# Patient Record
Sex: Male | Born: 1955 | Race: White | Hispanic: No | Marital: Married | Smoking: Never smoker
Health system: Southern US, Community
[De-identification: ages and names within clinical notes are randomized; demographics above are authoritative.]

## PROBLEM LIST (undated history)

## (undated) DIAGNOSIS — F329 Major depressive disorder, single episode, unspecified: Secondary | ICD-10-CM

## (undated) DIAGNOSIS — F32A Depression, unspecified: Secondary | ICD-10-CM

## (undated) DIAGNOSIS — I10 Essential (primary) hypertension: Secondary | ICD-10-CM

## (undated) DIAGNOSIS — T7840XA Allergy, unspecified, initial encounter: Secondary | ICD-10-CM

## (undated) HISTORY — PX: APPENDECTOMY: SHX54

## (undated) HISTORY — DX: Allergy, unspecified, initial encounter: T78.40XA

## (undated) HISTORY — DX: Essential (primary) hypertension: I10

## (undated) HISTORY — DX: Major depressive disorder, single episode, unspecified: F32.9

## (undated) HISTORY — DX: Depression, unspecified: F32.A

---

## 2004-04-24 ENCOUNTER — Inpatient Hospital Stay (HOSPITAL_COMMUNITY): Admission: EM | Admit: 2004-04-24 | Discharge: 2004-04-25 | Payer: Self-pay | Admitting: Family Medicine

## 2004-04-24 ENCOUNTER — Encounter (INDEPENDENT_AMBULATORY_CARE_PROVIDER_SITE_OTHER): Payer: Self-pay | Admitting: Specialist

## 2004-04-24 ENCOUNTER — Ambulatory Visit (HOSPITAL_COMMUNITY): Admission: RE | Admit: 2004-04-24 | Discharge: 2004-04-24 | Payer: Self-pay | Admitting: Family Medicine

## 2007-02-28 ENCOUNTER — Emergency Department (HOSPITAL_COMMUNITY): Admission: EM | Admit: 2007-02-28 | Discharge: 2007-02-28 | Payer: Self-pay | Admitting: Emergency Medicine

## 2007-11-10 ENCOUNTER — Ambulatory Visit (HOSPITAL_COMMUNITY): Admission: RE | Admit: 2007-11-10 | Discharge: 2007-11-10 | Payer: Self-pay | Admitting: Gastroenterology

## 2009-02-10 ENCOUNTER — Emergency Department (HOSPITAL_COMMUNITY): Admission: EM | Admit: 2009-02-10 | Discharge: 2009-02-10 | Payer: Self-pay | Admitting: Family Medicine

## 2010-06-27 NOTE — Op Note (Signed)
NAMEVANDY, Jeremiah Mitchell             ACCOUNT NO.:  [id number]   MEDICAL RECORD NO.:  [id number]          PATIENT TYPE:  AMB   LOCATION:  ENDO                         FACILITY:  [facility]   PHYSICIAN:  James L. Malon Kindle., M.D.DATE OF BIRTH:  1956-02-03   DATE OF PROCEDURE:  11/10/2007  DATE OF DISCHARGE:                               OPERATIVE REPORT   PROCEDURE:  Colonoscopy.   MEDICATIONS:  Fentanyl 75 mcg, Versed 7.5 mg IV.   INDICATIONS:  Cancer screening.   DESCRIPTION OF PROCEDURE:  In the left lateral decubitus position, the  Pentax pediatric colonoscope was inserted and advanced. Prep excellent.  Reached the cecum using abdominal pressure and placing the patient in  the supine position.  The ileocecal valve and appendiceal orifice were  seen. The scope was withdrawn. The cecum, ascending, transverse,  descending and sigmoid colons seen well. No diverticulosis, polyps,  masses or other lesions. No significant hemorrhoids in the rectum on  forward and retroflexed views. Scope withdrawn.  The patient tolerated  the procedure well.   ASSESSMENT:  Normal screening colonoscopy.   PLAN:  Routine follow-up with stool Hemoccults in 3-5 years and repeat  colonoscopy in 10 years.           ______________________________  Llana Aliment Malon Kindle., M.D.     Waldron Session  D:  11/10/2007  T:  11/10/2007  Job:  [id number]

## 2010-06-30 NOTE — H&P (Signed)
NAMESAADIQ, POCHE             ACCOUNT NO.:  [id number]   MEDICAL RECORD NO.:  [id number]          PATIENT TYPE:  OUT   LOCATION:  XRAY                         FACILITY:  [facility]   PHYSICIAN:  Cherylynn Ridges III, M.D.DATE OF BIRTH:  04/17/1955   DATE OF ADMISSION:  04/24/2004  DATE OF DISCHARGE:                                HISTORY & PHYSICAL   CHIEF COMPLAINT:  The patient is a 55 year old with acute appendicitis.   HISTORY OF PRESENT ILLNESS:  The patient was well until yesterday when he  developed abdominal pain which actually worsened throughout the evening, but  then got better later on. He got ill again today, was not scheduled to go  into work, but would not have gone into work and came into the emergency  room with abdominal pain localized to the right lower quadrant.  He went to  Urgent Care and was subsequently sent to the ED after CT demonstrated acute  appendicitis.  Surgical consultation was obtained.   PAST MEDICAL HISTORY:  Unremarkable.   PAST SURGICAL HISTORY:  None.   ALLERGIES:  PENICILLIN.   MEDICATIONS:  None.   FAMILY HISTORY:  Unremarkable with the exception that his mother is a  longterm retired Engineer, civil (consulting) from the [employer] emergency department.   REVIEW OF SYSTEMS:  He has had no nausea and no vomiting, but he has had  some chills and fevers at home.   PHYSICAL EXAMINATION:  VITAL SIGNS:  He is afebrile.  Other vital signs are  stable.  HEENT:  He is normocephalic, atraumatic, and anicteric.  NECK:  Supple.  CHEST:  Clear to auscultation.  Normal excursion.  HEART:  Regular rate and rhythm with no murmurs, rubs, or gallops or heaves.  ABDOMEN:  Soft and tender in the right lower quadrant with some localized  guarding.  RECTAL:  No palpable masses.  NEUROLOGY:  Cranial nerves II-XII grossly intact.   LABORATORY DATA:  White count over 11,000 with a left shift, hemoglobin is  normal.   IMPRESSION:  Acute appendicitis.   PLAN:   Laparoscopic appendectomy and possible open.  The risks and benefits  have been explained to the patient and he wishes to proceed.      JOW/MEDQ  D:  04/24/2004  T:  04/24/2004  Job:  [id number]

## 2010-06-30 NOTE — Op Note (Signed)
NAMEJAKOBIE, Jeremiah Mitchell             ACCOUNT NO.:  [id number]   MEDICAL RECORD NO.:  [id number]          PATIENT TYPE:  INP   LOCATION:  3001                         FACILITY:  [facility]   PHYSICIAN:  Cherylynn Ridges III, M.D.DATE OF BIRTH:  06/02/1955   DATE OF PROCEDURE:  04/24/2004  DATE OF DISCHARGE:                                 OPERATIVE REPORT   PREOPERATIVE DIAGNOSIS:  Acute appendicitis.   POSTOPERATIVE DIAGNOSIS:  Acute appendicitis.   PROCEDURE:  Laparoscopic appendectomy.   SURGEON:  Cherylynn Ridges, M.D.   ASSISTANT:  None.   ANESTHESIA:  General endotracheal.   ESTIMATED BLOOD LOSS:  Less than 20 mL.   COMPLICATIONS:  None.   CONDITION:  Stable.   FINDINGS:  Acutely inflamed appendix without perforation.   INDICATION FOR OPERATION:  The patient is a 55 year old with localized right  lower quadrant tenderness associated with mildly elevated white blood cell  count, who now comes in for a laparoscopic appendectomy.   OPERATION:  The patient was taken to the operating room and placed on the  table in supine position.  After an adequate endotracheal anesthetic was  administered, he was prepped and draped in the usual sterile manner exposing  the midline and the right lower quadrant.   A supraumbilical curvilinear incision was made using a #11 blade.  It was  taken down to the midline fascia using hemostat clamp, and it was through  that midline fascia that a Veress needle was passed into the peritoneal  cavity and confirmed to be in position using the saline test.  Once this was  done, carbon dioxide insufflation was instilled through the Veress needle  into the peritoneal cavity up to a maximal intra-abdominal pressure of 15  mmHg.   Once this was done, an Optiview 11 mm trocar and cannula were passed through  the supraumbilical fascia into the peritoneal cavity with the inserted  laparoscope with attached camera and light source.  We subsequently passed  the  right costal margin 5 mm cannula and a suprapubic 12 mm cannula under  direct vision into the peritoneal cavity.  Once this was done, the patient  was placed in Trendelenburg, the left side was tilted down.   The appendix could be seen attached sort of to the right lower quadrant and  right paracolic area but was not retroperitonealized.  We were able to  mobilize it up into the midportion of the wound and subsequently dissect out  the appendix at the base of the cecum.  We separated it and got an adequate  window between that, the mesoappendix and the base of the cecum.  It was  through this window that a 3.5 mm Endo-GIA was passed around the base of the  cecum and the appendix, transecting it at that point.  We then used the 2.5  mm stapler and cutter across the mesoappendix, transecting it.  We brought  the appendix out through the suprapubic cannula site using an EndoCatch bag,  not contaminating the skin.   We reinserted the suprapubic cannula, then irrigated in the right lower  quadrant with saline.  Once this was done and we had adequate hemostasis, we  removed all cannulas and aspirated all gas.   The supraumbilical fascia was reapproximated using a figure-of-eight stitch  of 0 Vicryl.  The skin was closed at all sites using a running subcuticular  stitch of 5-0 Vicryl.  Marcaine 0.25% with epinephrine was injected at all  sites.  All needle counts, sponge counts and instrument counts were correct.      JOW/MEDQ  D:  04/24/2004  T:  04/25/2004  Job:  [id number]

## 2010-11-02 LAB — POCT URINALYSIS DIP (DEVICE)
Glucose, UA: NEGATIVE
Hgb urine dipstick: NEGATIVE
Ketones, ur: NEGATIVE
Nitrite: NEGATIVE
Specific Gravity, Urine: 1.015
Urobilinogen, UA: 1
pH: 7

## 2011-07-20 ENCOUNTER — Inpatient Hospital Stay (HOSPITAL_COMMUNITY)
Admission: EM | Admit: 2011-07-20 | Discharge: 2011-07-22 | DRG: 690 | Disposition: A | Discharge status: Home / Self Care | Payer: 59 | Source: Ambulatory Visit | Attending: Internal Medicine | Admitting: Internal Medicine

## 2011-07-20 ENCOUNTER — Encounter (HOSPITAL_COMMUNITY): Payer: Self-pay | Admitting: *Deleted

## 2011-07-20 ENCOUNTER — Emergency Department (HOSPITAL_COMMUNITY): Payer: 59

## 2011-07-20 DIAGNOSIS — N12 Tubulo-interstitial nephritis, not specified as acute or chronic: Secondary | ICD-10-CM

## 2011-07-20 DIAGNOSIS — K8020 Calculus of gallbladder without cholecystitis without obstruction: Secondary | ICD-10-CM | POA: Diagnosis present

## 2011-07-20 DIAGNOSIS — N10 Acute tubulo-interstitial nephritis: Principal | ICD-10-CM | POA: Diagnosis present

## 2011-07-20 LAB — CBC
HCT: 43.9 % (ref 39.0–52.0)
Hemoglobin: 15.4 g/dL (ref 13.0–17.0)
MCH: 31.4 pg (ref 26.0–34.0)
MCHC: 35.1 g/dL (ref 30.0–36.0)
MCV: 89.6 fL (ref 78.0–100.0)
Platelets: 244 K/uL (ref 150–400)
RBC: 4.9 MIL/uL (ref 4.22–5.81)
RDW: 13 % (ref 11.5–15.5)
WBC: 19.7 K/uL — ABNORMAL HIGH (ref 4.0–10.5)

## 2011-07-20 LAB — COMPREHENSIVE METABOLIC PANEL
AST: 16 U/L (ref 0–37)
Alkaline Phosphatase: 78 U/L (ref 39–117)
BUN: 15 mg/dL (ref 6–23)
CO2: 23 mEq/L (ref 19–32)
Calcium: 9.6 mg/dL (ref 8.4–10.5)
Chloride: 101 mEq/L (ref 96–112)
Creatinine, Ser: 1.08 mg/dL (ref 0.50–1.35)
GFR calc Af Amer: 87 mL/min — ABNORMAL LOW (ref >90)
Glucose, Bld: 141 mg/dL — ABNORMAL HIGH (ref 70–99)
Potassium: 3.8 mEq/L (ref 3.5–5.1)
Sodium: 138 mEq/L (ref 135–145)
Total Protein: 7.8 g/dL (ref 6.0–8.3)

## 2011-07-20 LAB — URINE MICROSCOPIC-ADD ON

## 2011-07-20 LAB — LIPASE, BLOOD: Lipase: 21 U/L (ref 11–59)

## 2011-07-20 LAB — DIFFERENTIAL
Basophils Absolute: 0 K/uL (ref 0.0–0.1)
Basophils Relative: 0 % (ref 0–1)
Eosinophils Absolute: 0 K/uL (ref 0.0–0.7)
Eosinophils Relative: 0 % (ref 0–5)
Lymphocytes Relative: 6 % — ABNORMAL LOW (ref 12–46)
Lymphs Abs: 1.1 K/uL (ref 0.7–4.0)
Monocytes Absolute: 1.5 K/uL — ABNORMAL HIGH (ref 0.1–1.0)
Monocytes Relative: 7 % (ref 3–12)
Neutro Abs: 17.1 K/uL — ABNORMAL HIGH (ref 1.7–7.7)
Neutrophils Relative %: 87 % — ABNORMAL HIGH (ref 43–77)

## 2011-07-20 LAB — OCCULT BLOOD, POC DEVICE: Fecal Occult Bld: NEGATIVE

## 2011-07-20 LAB — URINALYSIS, ROUTINE W REFLEX MICROSCOPIC
Glucose, UA: NEGATIVE mg/dL
Ketones, ur: 15 mg/dL — AB
Protein, ur: 30 mg/dL — AB
Urobilinogen, UA: 1 mg/dL (ref 0.0–1.0)

## 2011-07-20 MED ORDER — IOHEXOL 300 MG/ML  SOLN
20.0000 mL | INTRAMUSCULAR | Status: AC
  Administered 2011-07-20 (×2): 20 mL via ORAL

## 2011-07-20 MED ORDER — DEXTROSE 5 % IV SOLN
1.0000 g | Freq: Once | INTRAVENOUS | Status: AC
  Administered 2011-07-20: 1 g via INTRAVENOUS
  Filled 2011-07-20: qty 10

## 2011-07-20 MED ORDER — ONDANSETRON HCL 4 MG/2ML IJ SOLN
4.0000 mg | Freq: Once | INTRAMUSCULAR | Status: AC
  Administered 2011-07-20: 4 mg via INTRAVENOUS
  Filled 2011-07-20: qty 2

## 2011-07-20 MED ORDER — IOHEXOL 300 MG/ML  SOLN
100.0000 mL | Freq: Once | INTRAMUSCULAR | Status: AC | PRN
  Administered 2011-07-20: 100 mL via INTRAVENOUS

## 2011-07-20 MED ORDER — MORPHINE SULFATE 4 MG/ML IJ SOLN
4.0000 mg | Freq: Once | INTRAMUSCULAR | Status: AC
  Administered 2011-07-20: 4 mg via INTRAVENOUS
  Filled 2011-07-20: qty 1

## 2011-07-20 NOTE — ED Provider Notes (Signed)
History     CSN: [id number]  Arrival date & time 07/20/11  1931   First MD Initiated Contact with Patient 07/20/11 2102      Chief Complaint  Patient presents with  . Abdominal Pain    (Consider location/radiation/quality/duration/timing/severity/associated sxs/prior treatment) HPI  56 year old male with prior history of appendectomy presents complaining of abdominal pain. Patient states this morning he was awoke with an acute sharp pain to his left lower quadrant abdomen. Pain is persistence, with associated nausea, vomiting, and diarrhea. He has had 9-10 bouts of nonbloody diarrhea throughout the day. States nothing seems to improve his symptoms. Positional change did not help. He took 2 ibuprofen without relief. Patient also endorsed fever, as high as 101.9 at home, with associated chills. He denies chest pain, shortness of breath, urinary symptoms, or rash. States that he was normal yesterday. The patient has had colonoscopy 3 years ago that was unremarkable. Patient denies prior history of gallbladder disease. He denies any recent alcohol use. Patient presents to the urgent care today for evaluation and was recommended to come to the ER. Patient has no prior history of kidney stone.  History reviewed. No pertinent past medical history.  Past Surgical History  Procedure Date  . Appendectomy     No family history on file.  History  Substance Use Topics  . Smoking status: Never Smoker   . Smokeless tobacco: Not on file  . Alcohol Use: No      Review of Systems  All other systems reviewed and are negative.    Allergies  Penicillins  Home Medications  No current outpatient prescriptions on file.  BP 122/73  Pulse 104  Temp(Src) 98.2 F (36.8 C) (Oral)  Resp 18  SpO2 98%  Physical Exam  Nursing note and vitals reviewed. Constitutional: He appears well-developed and well-nourished. No distress.       Awake, alert, nontoxic appearance  HENT:  Head: Atraumatic.    Mouth/Throat: Oropharynx is clear and moist. No oropharyngeal exudate.  Eyes: Conjunctivae are normal. Right eye exhibits no discharge. Left eye exhibits no discharge.  Neck: Normal range of motion. Neck supple.  Cardiovascular: Normal rate and regular rhythm.   Pulmonary/Chest: Effort normal. No respiratory distress. He exhibits no tenderness.  Abdominal: Soft. There is tenderness in the left lower quadrant. There is rigidity, rebound, guarding and CVA tenderness. There is no tenderness at McBurney's point and negative Murphy's sign. No hernia. Hernia confirmed negative in the ventral area, confirmed negative in the right inguinal area and confirmed negative in the left inguinal area.    Genitourinary: Rectum normal and penis normal. Rectal exam shows no tenderness and anal tone normal. Guaiac negative stool. Right testis shows tenderness. Right testis shows no swelling. Right testis is descended. Left testis shows no swelling and no tenderness. Left testis is descended. Circumcised.  Musculoskeletal: He exhibits no tenderness.       ROM appears intact, no obvious focal weakness  Lymphadenopathy:       Right: No inguinal adenopathy present.       Left: No inguinal adenopathy present.  Neurological: He is alert.  Skin: Skin is warm and dry. No rash noted.  Psychiatric: He has a normal mood and affect.    ED Course  Procedures (including critical care time)  Labs Reviewed  URINALYSIS, ROUTINE W REFLEX MICROSCOPIC - Abnormal; Notable for the following:    Color, Urine AMBER (*) BIOCHEMICALS MAY BE AFFECTED BY COLOR   APPearance CLOUDY (*)  Hgb urine dipstick TRACE (*)    Ketones, ur 15 (*)    Protein, ur 30 (*)    Nitrite POSITIVE (*)    Leukocytes, UA MODERATE (*)    All other components within normal limits  CBC - Abnormal; Notable for the following:    WBC 19.7 (*)    All other components within normal limits  DIFFERENTIAL - Abnormal; Notable for the following:    Neutrophils  Relative 87 (*)    Neutro Abs 17.1 (*)    Lymphocytes Relative 6 (*)    Monocytes Absolute 1.5 (*)    All other components within normal limits  URINE MICROSCOPIC-ADD ON - Abnormal; Notable for the following:    Bacteria, UA MANY (*)    All other components within normal limits  COMPREHENSIVE METABOLIC PANEL  LIPASE, BLOOD   No results found.   No diagnosis found.  Results for orders placed during the hospital encounter of 07/20/11  URINALYSIS, ROUTINE W REFLEX MICROSCOPIC      Component Value Range   Color, Urine AMBER (*) YELLOW    APPearance CLOUDY (*) CLEAR    Specific Gravity, Urine 1.029  1.005 - 1.030    pH 5.5  5.0 - 8.0    Glucose, UA NEGATIVE  NEGATIVE (mg/dL)   Hgb urine dipstick TRACE (*) NEGATIVE    Bilirubin Urine NEGATIVE  NEGATIVE    Ketones, ur 15 (*) NEGATIVE (mg/dL)   Protein, ur 30 (*) NEGATIVE (mg/dL)   Urobilinogen, UA 1.0  0.0 - 1.0 (mg/dL)   Nitrite POSITIVE (*) NEGATIVE    Leukocytes, UA MODERATE (*) NEGATIVE   CBC      Component Value Range   WBC 19.7 (*) 4.0 - 10.5 (K/uL)   RBC 4.90  4.22 - 5.81 (MIL/uL)   Hemoglobin 15.4  13.0 - 17.0 (g/dL)   HCT 16.1  09.6 - 04.5 (%)   MCV 89.6  78.0 - 100.0 (fL)   MCH 31.4  26.0 - 34.0 (pg)   MCHC 35.1  30.0 - 36.0 (g/dL)   RDW 40.9  81.1 - 91.4 (%)   Platelets 244  150 - 400 (K/uL)  DIFFERENTIAL      Component Value Range   Neutrophils Relative 87 (*) 43 - 77 (%)   Neutro Abs 17.1 (*) 1.7 - 7.7 (K/uL)   Lymphocytes Relative 6 (*) 12 - 46 (%)   Lymphs Abs 1.1  0.7 - 4.0 (K/uL)   Monocytes Relative 7  3 - 12 (%)   Monocytes Absolute 1.5 (*) 0.1 - 1.0 (K/uL)   Eosinophils Relative 0  0 - 5 (%)   Eosinophils Absolute 0.0  0.0 - 0.7 (K/uL)   Basophils Relative 0  0 - 1 (%)   Basophils Absolute 0.0  0.0 - 0.1 (K/uL)  COMPREHENSIVE METABOLIC PANEL      Component Value Range   Sodium 138  135 - 145 (mEq/L)   Potassium 3.8  3.5 - 5.1 (mEq/L)   Chloride 101  96 - 112 (mEq/L)   CO2 23  19 - 32 (mEq/L)    Glucose, Bld 141 (*) 70 - 99 (mg/dL)   BUN 15  6 - 23 (mg/dL)   Creatinine, Ser 7.82  0.50 - 1.35 (mg/dL)   Calcium 9.6  8.4 - 95.6 (mg/dL)   Total Protein 7.8  6.0 - 8.3 (g/dL)   Albumin 4.1  3.5 - 5.2 (g/dL)   AST 16  0 - 37 (U/L)   ALT 24  0 - 53 (U/L)   Alkaline Phosphatase 78  39 - 117 (U/L)   Total Bilirubin 1.5 (*) 0.3 - 1.2 (mg/dL)   GFR calc non Af Amer 75 (*) >90 (mL/min)   GFR calc Af Amer 87 (*) >90 (mL/min)  LIPASE, BLOOD      Component Value Range   Lipase 21  11 - 59 (U/L)  URINE MICROSCOPIC-ADD ON      Component Value Range   Squamous Epithelial / LPF RARE  RARE    WBC, UA 21-50  <3 (WBC/hpf)   RBC / HPF 0-2  <3 (RBC/hpf)   Bacteria, UA MANY (*) RARE    Urine-Other MUCOUS PRESENT    OCCULT BLOOD, POC DEVICE      Component Value Range   Fecal Occult Bld NEGATIVE     Ct Abdomen Pelvis W Contrast  07/20/2011  *RADIOLOGY REPORT*  Clinical Data: Abdominal pain, nausea/vomiting, diarrhea  CT ABDOMEN AND PELVIS WITH CONTRAST  Technique:  Multidetector CT imaging of the abdomen and pelvis was performed following the standard protocol during bolus administration of intravenous contrast.  Contrast: OMNIPAQUE IOHEXOL 300 MG/ML  SOLN  Comparison: 04/24/2004  Findings: Lung bases are clear.  Liver, spleen, pancreas, and adrenal glands are within normal limits.  Cholelithiasis.  No intrahepatic/extrahepatic ductal dilatation.  Kidneys are within normal limits.  Bilateral extrarenal pelvis.  No hydronephrosis.  No evidence of bowel obstruction.  Status post appendectomy.  No colonic wall thickening or inflammatory changes.  Atherosclerotic calcifications of the abdominal aorta and branch vessels.  No abdominopelvic ascites.  Small retroperitoneal lymph nodes which do not meet pathologic CT size criteria prostate is top normal in size, measuring 4.5 cm in transverse dimension.  Bladder is within normal limits.  Mild degenerative changes of the visualized thoracolumbar spine.   IMPRESSION: No evidence of bowel obstruction.  Cholelithiasis, without associated inflammatory changes.  Status post appendectomy.  Original Report Authenticated By: Charline Bills, M.D.      MDM  Patient presents with left lower quadrant abdominal pain. He has peritoneal signs. Will obtain abdominal and pelvis CT with contrast for further evaluation.  11:47 PM Abdominal and pelvis CT scan is only remarkable for cholelithiasis without evidence of inflammatory changes. No other concerning findings. Patient's urine shows evidence of urinary tract infection. Urine culture sent. Rocephin given in the ED. Patient does have tenderness to his right testicle without evidence of inflammation, swelling, or overlying skin changes. I discussed the result with my attending will evaluate the patient.  12:02 AM Pt will be admitted for pyelonephritis.  I have consulted with Dr. Mikeal Hawthorne from [facility], who agrees to admit this unassigned pt.  Pt to be admit to medsurg bed, Team 5, under the care of doctor Jomarie Longs.  Pt is aware of plan.        Fayrene Helper, PA-C 07/21/11 0021

## 2011-07-20 NOTE — ED Notes (Signed)
abd pain since this am with nv and diarrhea. Marland Kitchen

## 2011-07-20 NOTE — ED Notes (Signed)
Pt states pain in LRQ rates 8/10

## 2011-07-20 NOTE — ED Notes (Signed)
Return from ct scan.

## 2011-07-21 ENCOUNTER — Encounter (HOSPITAL_COMMUNITY): Payer: Self-pay | Admitting: Internal Medicine

## 2011-07-21 DIAGNOSIS — K8020 Calculus of gallbladder without cholecystitis without obstruction: Secondary | ICD-10-CM | POA: Diagnosis present

## 2011-07-21 DIAGNOSIS — N10 Acute tubulo-interstitial nephritis: Secondary | ICD-10-CM | POA: Diagnosis present

## 2011-07-21 DIAGNOSIS — D7289 Other specified disorders of white blood cells: Secondary | ICD-10-CM

## 2011-07-21 LAB — BASIC METABOLIC PANEL
BUN: 13 mg/dL (ref 6–23)
CO2: 25 mEq/L (ref 19–32)
Calcium: 8.5 mg/dL (ref 8.4–10.5)
Creatinine, Ser: 1.1 mg/dL (ref 0.50–1.35)
GFR calc Af Amer: 86 mL/min — ABNORMAL LOW (ref >90)

## 2011-07-21 LAB — CBC
MCHC: 34.5 g/dL (ref 30.0–36.0)
MCV: 90.3 fL (ref 78.0–100.0)
Platelets: 189 10*3/uL (ref 150–400)
RDW: 13.1 % (ref 11.5–15.5)
WBC: 18.5 10*3/uL — ABNORMAL HIGH (ref 4.0–10.5)

## 2011-07-21 MED ORDER — ONDANSETRON HCL 4 MG PO TABS: 4.0000 mg | ORAL_TABLET | Freq: Four times a day (QID) | ORAL | Status: DC | PRN

## 2011-07-21 MED ORDER — SODIUM CHLORIDE 0.9 % IV SOLN: INTRAVENOUS | Status: AC

## 2011-07-21 MED ORDER — ONDANSETRON HCL 4 MG/2ML IJ SOLN
4.0000 mg | Freq: Three times a day (TID) | INTRAMUSCULAR | Status: AC | PRN
  Filled 2011-07-21: qty 2

## 2011-07-21 MED ORDER — ACETAMINOPHEN 325 MG PO TABS
650.0000 mg | ORAL_TABLET | Freq: Four times a day (QID) | ORAL | Status: DC | PRN
  Administered 2011-07-21: 650 mg via ORAL
  Filled 2011-07-21: qty 2

## 2011-07-21 MED ORDER — HYDROMORPHONE HCL PF 1 MG/ML IJ SOLN: 1.0000 mg | INTRAMUSCULAR | Status: AC | PRN

## 2011-07-21 MED ORDER — SODIUM CHLORIDE 0.9 % IV SOLN
INTRAVENOUS | Status: DC
  Administered 2011-07-21 (×2): via INTRAVENOUS

## 2011-07-21 MED ORDER — MORPHINE SULFATE 2 MG/ML IJ SOLN: 2.0000 mg | INTRAMUSCULAR | Status: DC | PRN

## 2011-07-21 MED ORDER — CIPROFLOXACIN IN D5W 400 MG/200ML IV SOLN
400.0000 mg | Freq: Two times a day (BID) | INTRAVENOUS | Status: DC
  Administered 2011-07-21 – 2011-07-22 (×3): 400 mg via INTRAVENOUS
  Filled 2011-07-21 (×4): qty 200

## 2011-07-21 MED ORDER — ACETAMINOPHEN 650 MG RE SUPP: 650.0000 mg | Freq: Four times a day (QID) | RECTAL | Status: DC | PRN

## 2011-07-21 MED ORDER — HYDROCODONE-ACETAMINOPHEN 5-325 MG PO TABS: 1.0000 | ORAL_TABLET | ORAL | Status: DC | PRN

## 2011-07-21 MED ORDER — ACETAMINOPHEN 325 MG PO TABS
650.0000 mg | ORAL_TABLET | Freq: Once | ORAL | Status: AC
  Administered 2011-07-21: 650 mg via ORAL
  Filled 2011-07-21: qty 2

## 2011-07-21 MED ORDER — ONDANSETRON HCL 4 MG/2ML IJ SOLN
4.0000 mg | Freq: Four times a day (QID) | INTRAMUSCULAR | Status: DC | PRN
  Administered 2011-07-21 (×2): 4 mg via INTRAVENOUS
  Filled 2011-07-21: qty 2

## 2011-07-21 MED ORDER — ENOXAPARIN SODIUM 40 MG/0.4ML ~~LOC~~ SOLN
40.0000 mg | SUBCUTANEOUS | Status: DC
  Administered 2011-07-21: 40 mg via SUBCUTANEOUS
  Filled 2011-07-21 (×2): qty 0.4

## 2011-07-21 NOTE — Progress Notes (Addendum)
Pt seen and examined, admitted this am by Dr. Mikeal Hawthorne, starting to feel better, exam significant for L CVA tenderness A/P: Pyelonephritis: continue IV cipro/IVF FU urine CX FU with Urology after DC  Zannie Cove, MD [phone]

## 2011-07-21 NOTE — ED Provider Notes (Signed)
56 yo man with bilateral CVA pain and fever, lab tests showing WBC around 19,000, UA with 21-50 WBCs.  CT of abdomen shows cholelithiasis, no renal or ureteral calculi.  Dx pyelonephritis, admit for IV antibiotics.  [name], MD 07/21/11 1352

## 2011-07-21 NOTE — ED Notes (Signed)
Report called to [facility] pt ready for transport, floor ask for 15 minutes before transport. Pt informed of plan of care

## 2011-07-21 NOTE — Progress Notes (Signed)
12:01 AM Pt with bilateral flank pain, subjective complaint of fever, exam showing bilateral CVA tenderness, WBC elevated at 19,000, UA showing 21-50 WBC's, CT of abdomen showing gallstones but no ureterolithisis.  Dx pyelonephritis, Rx IV antibiotics, admission.

## 2011-07-21 NOTE — H&P (Signed)
Jeremiah Mitchell is an 56 y.o. male.   Chief Complaint: Fever and flank pain HPI: 56 year old gentleman who has no significant past medical history except for appendectomy couple of years ago here with 3 days of nausea fever up to 102 and left flank pain. Patient's symptoms initially started with flank pain after he was working in the yard. He thought it was probably due to pulling a muscle. Today however he started having fever and chills as well as left lower quadrant abdominal pain. He went to urgent care Center where he was evaluated and told to come to the emergency room. He PET patient was found to have evidence of urinary tract infection with CVA tenderness. He has been out of UTIs before. Nose and surgeries. He had associated nausea but no vomiting.  History reviewed. No pertinent past medical history.  Past Surgical History  Procedure Date  . Appendectomy     No family history on file. Social History:  reports that he has never smoked. He does not have any smokeless tobacco history on file. He reports that he does not drink alcohol. His drug history not on file.  Allergies:  Allergies  Allergen Reactions  . Penicillins     unknown    No prescriptions prior to admission    Results for orders placed during the hospital encounter of 07/20/11 (from the past 48 hour(s))  URINALYSIS, ROUTINE W REFLEX MICROSCOPIC     Status: Abnormal   Collection Time   07/20/11  8:03 PM      Component Value Range Comment   Color, Urine AMBER (*) YELLOW  BIOCHEMICALS MAY BE AFFECTED BY COLOR   APPearance CLOUDY (*) CLEAR     Specific Gravity, Urine 1.029  1.005 - 1.030     pH 5.5  5.0 - 8.0     Glucose, UA NEGATIVE  NEGATIVE (mg/dL)    Hgb urine dipstick TRACE (*) NEGATIVE     Bilirubin Urine NEGATIVE  NEGATIVE     Ketones, ur 15 (*) NEGATIVE (mg/dL)    Protein, ur 30 (*) NEGATIVE (mg/dL)    Urobilinogen, UA 1.0  0.0 - 1.0 (mg/dL)    Nitrite POSITIVE (*) NEGATIVE     Leukocytes, UA MODERATE (*)  NEGATIVE    URINE MICROSCOPIC-ADD ON     Status: Abnormal   Collection Time   07/20/11  8:03 PM      Component Value Range Comment   Squamous Epithelial / LPF RARE  RARE     WBC, UA 21-50  <3 (WBC/hpf)    RBC / HPF 0-2  <3 (RBC/hpf)    Bacteria, UA MANY (*) RARE     Urine-Other MUCOUS PRESENT     CBC     Status: Abnormal   Collection Time   07/20/11  8:06 PM      Component Value Range Comment   WBC 19.7 (*) 4.0 - 10.5 (K/uL)    RBC 4.90  4.22 - 5.81 (MIL/uL)    Hemoglobin 15.4  13.0 - 17.0 (g/dL)    HCT 40.9  81.1 - 91.4 (%)    MCV 89.6  78.0 - 100.0 (fL)    MCH 31.4  26.0 - 34.0 (pg)    MCHC 35.1  30.0 - 36.0 (g/dL)    RDW 78.2  95.6 - 21.3 (%)    Platelets 244  150 - 400 (K/uL)   DIFFERENTIAL     Status: Abnormal   Collection Time   07/20/11  8:06 PM  Component Value Range Comment   Neutrophils Relative 87 (*) 43 - 77 (%)    Neutro Abs 17.1 (*) 1.7 - 7.7 (K/uL)    Lymphocytes Relative 6 (*) 12 - 46 (%)    Lymphs Abs 1.1  0.7 - 4.0 (K/uL)    Monocytes Relative 7  3 - 12 (%)    Monocytes Absolute 1.5 (*) 0.1 - 1.0 (K/uL)    Eosinophils Relative 0  0 - 5 (%)    Eosinophils Absolute 0.0  0.0 - 0.7 (K/uL)    Basophils Relative 0  0 - 1 (%)    Basophils Absolute 0.0  0.0 - 0.1 (K/uL)   COMPREHENSIVE METABOLIC PANEL     Status: Abnormal   Collection Time   07/20/11  8:06 PM      Component Value Range Comment   Sodium 138  135 - 145 (mEq/L)    Potassium 3.8  3.5 - 5.1 (mEq/L)    Chloride 101  96 - 112 (mEq/L)    CO2 23  19 - 32 (mEq/L)    Glucose, Bld 141 (*) 70 - 99 (mg/dL)    BUN 15  6 - 23 (mg/dL)    Creatinine, Ser 4.09  0.50 - 1.35 (mg/dL)    Calcium 9.6  8.4 - 10.5 (mg/dL)    Total Protein 7.8  6.0 - 8.3 (g/dL)    Albumin 4.1  3.5 - 5.2 (g/dL)    AST 16  0 - 37 (U/L)    ALT 24  0 - 53 (U/L)    Alkaline Phosphatase 78  39 - 117 (U/L)    Total Bilirubin 1.5 (*) 0.3 - 1.2 (mg/dL)    GFR calc non Af Amer 75 (*) >90 (mL/min)    GFR calc Af Amer 87 (*) >90 (mL/min)     LIPASE, BLOOD     Status: Normal   Collection Time   07/20/11  8:06 PM      Component Value Range Comment   Lipase 21  11 - 59 (U/L)   OCCULT BLOOD, POC DEVICE     Status: Normal   Collection Time   07/20/11  9:46 PM      Component Value Range Comment   Fecal Occult Bld NEGATIVE      Ct Abdomen Pelvis W Contrast  07/20/2011  *RADIOLOGY REPORT*  Clinical Data: Abdominal pain, nausea/vomiting, diarrhea  CT ABDOMEN AND PELVIS WITH CONTRAST  Technique:  Multidetector CT imaging of the abdomen and pelvis was performed following the standard protocol during bolus administration of intravenous contrast.  Contrast: OMNIPAQUE IOHEXOL 300 MG/ML  SOLN  Comparison: 04/24/2004  Findings: Lung bases are clear.  Liver, spleen, pancreas, and adrenal glands are within normal limits.  Cholelithiasis.  No intrahepatic/extrahepatic ductal dilatation.  Kidneys are within normal limits.  Bilateral extrarenal pelvis.  No hydronephrosis.  No evidence of bowel obstruction.  Status post appendectomy.  No colonic wall thickening or inflammatory changes.  Atherosclerotic calcifications of the abdominal aorta and branch vessels.  No abdominopelvic ascites.  Small retroperitoneal lymph nodes which do not meet pathologic CT size criteria prostate is top normal in size, measuring 4.5 cm in transverse dimension.  Bladder is within normal limits.  Mild degenerative changes of the visualized thoracolumbar spine.  IMPRESSION: No evidence of bowel obstruction.  Cholelithiasis, without associated inflammatory changes.  Status post appendectomy.  Original Report Authenticated By: Charline Bills, M.D.    Review of Systems  Constitutional: Positive for fever and chills.  HENT:  Negative.   Eyes: Negative.   Respiratory: Negative.   Cardiovascular: Negative.   Gastrointestinal: Positive for nausea and abdominal pain.  Genitourinary: Positive for dysuria and flank pain.  Musculoskeletal: Negative.   Skin: Negative.    Neurological: Negative.   Endo/Heme/Allergies: Negative.   Psychiatric/Behavioral: Negative.     Blood pressure 130/77, pulse 71, temperature 98.3 F (36.8 C), temperature source Oral, resp. rate 18, SpO2 96.00%. Physical Exam  Constitutional: He is oriented to person, place, and time. He appears well-developed and well-nourished.  HENT:  Head: Normocephalic and atraumatic.  Right Ear: External ear normal.  Left Ear: External ear normal.  Nose: Nose normal.  Mouth/Throat: Oropharynx is clear and moist.  Eyes: Conjunctivae and EOM are normal. Pupils are equal, round, and reactive to light.  Neck: Normal range of motion. Neck supple.  Cardiovascular: Normal rate, regular rhythm, normal heart sounds and intact distal pulses.   Respiratory: Effort normal and breath sounds normal.  GI: Soft. Bowel sounds are normal.  Musculoskeletal: Normal range of motion.  Neurological: He is alert and oriented to person, place, and time. He has normal reflexes.  Skin: Skin is warm and dry.  Psychiatric: He has a normal mood and affect. His behavior is normal. Judgment and thought content normal.     Assessment/Plan A 56 year old gentleman presenting with acute pyelonephritis. Plan #1 acute pyelonephritis; patient will be admitted for IV antibiotics. He is allergic to penicillin so we'll use ciprofloxacin pending urine culture and blood cultures. If we get them back with sensitivities we'll determine what oral antibiotics to discharge him home on. In the meantime we'll hydrate him pain control and nausea control. #2 cholelithiasis: This is evidence of a CT abdomen. He does not seem to be asymptomatic and will not make any new changes or further evaluations.  Kaavya Puskarich,LAWAL 07/21/2011, 2:13 AM

## 2011-07-22 DIAGNOSIS — D7289 Other specified disorders of white blood cells: Secondary | ICD-10-CM

## 2011-07-22 DIAGNOSIS — K8020 Calculus of gallbladder without cholecystitis without obstruction: Secondary | ICD-10-CM

## 2011-07-22 DIAGNOSIS — N10 Acute tubulo-interstitial nephritis: Secondary | ICD-10-CM

## 2011-07-22 LAB — CBC
HCT: 37.9 % — ABNORMAL LOW (ref 39.0–52.0)
Hemoglobin: 12.9 g/dL — ABNORMAL LOW (ref 13.0–17.0)
MCH: 31.2 pg (ref 26.0–34.0)
MCHC: 34 g/dL (ref 30.0–36.0)
MCV: 91.5 fL (ref 78.0–100.0)
Platelets: 205 10*3/uL (ref 150–400)
RBC: 4.14 MIL/uL — ABNORMAL LOW (ref 4.22–5.81)
RDW: 13.1 % (ref 11.5–15.5)
WBC: 12.6 10*3/uL — ABNORMAL HIGH (ref 4.0–10.5)

## 2011-07-22 LAB — BASIC METABOLIC PANEL
Calcium: 8.6 mg/dL (ref 8.4–10.5)
GFR calc Af Amer: 87 mL/min — ABNORMAL LOW (ref >90)
GFR calc non Af Amer: 75 mL/min — ABNORMAL LOW (ref >90)
Glucose, Bld: 91 mg/dL (ref 70–99)
Potassium: 3.7 mEq/L (ref 3.5–5.1)
Sodium: 140 mEq/L (ref 135–145)

## 2011-07-22 MED ORDER — HYDROCODONE-ACETAMINOPHEN 5-325 MG PO TABS: 1.0000 | ORAL_TABLET | Freq: Four times a day (QID) | ORAL | Status: AC | PRN

## 2011-07-22 MED ORDER — CIPROFLOXACIN HCL 500 MG PO TABS: 500.0000 mg | ORAL_TABLET | Freq: Two times a day (BID) | ORAL | Status: AC

## 2011-07-22 NOTE — Progress Notes (Signed)
DC instructions given to pt at this time re:  F/u appts, meds, diet, activity, and s/s of problems to report to physician.  Pt verbalized understanding of all instructions.  No s/s of any acute distress.  Walked pt out to car, accompanied by wife.

## 2011-07-23 LAB — URINE CULTURE: Culture  Setup Time: 201306072248

## 2011-08-01 NOTE — Discharge Summary (Signed)
Physician Discharge Summary  Patient ID: Jeremiah Mitchell MRN: [id number] DOB/AGE: 1955/02/18 56 y.o.  Admit date: 07/20/2011 Discharge date: 08/01/2011  Primary Care Physician:  No primary provider on file.   Discharge Diagnoses:   Pyelonephritis, acute Cholelithiases    Medication List  As of 08/01/2011 10:44 PM   TAKE these medications         ciprofloxacin 500 MG tablet   Commonly known as: CIPRO   Take 1 tablet (500 mg total) by mouth 2 (two) times daily. For 5 days      HYDROcodone-acetaminophen 5-325 MG per tablet   Commonly known as: NORCO   Take 1 tablet by mouth every 6 (six) hours as needed.           Disposition and Follow-up:  PCP in 1 week Urology in 1 month  Significant Diagnostic Studies:  No results found.  Brief H and P: Chief Complaint: Fever and flank pain  HPI: 56 year old gentleman who has no significant past medical history except for appendectomy couple of years ago here with 3 days of nausea fever up to 102 and left flank pain. Patient's symptoms initially started with flank pain after he was working in the yard. He thought it was probably due to pulling a muscle. Today however he started having fever and chills as well as left lower quadrant abdominal pain. He went to urgent care Center where he was evaluated and told to come to the emergency room.  Patient was found to have evidence of urinary tract infection with CVA tenderness. He had associated nausea but no vomiting.  Hospital Course:  Pyelonephritis: clinically improved significantly with IV Ciprofloxacin and IVF Urine cultures negative initially and reincubated so pending at this point, but patient much improved and anxious to go home and hence will DC home on PO Ciprofloxacin He is advised to follow up with Urology in 1 month of DC  Time spent on Discharge:  Signed: Jasman Pfeifle [facility]  08/01/2011, 10:44 PM

## 2014-11-02 ENCOUNTER — Encounter: Payer: Self-pay | Admitting: Cardiology

## 2014-11-29 ENCOUNTER — Telehealth: Payer: Self-pay

## 2014-11-29 NOTE — Telephone Encounter (Signed)
LMTCO.

## 2014-11-30 ENCOUNTER — Encounter: Payer: Self-pay | Admitting: Cardiology

## 2014-11-30 ENCOUNTER — Ambulatory Visit (INDEPENDENT_AMBULATORY_CARE_PROVIDER_SITE_OTHER): Payer: 59 | Admitting: Cardiology

## 2014-11-30 VITALS — BP 120/82 | HR 54 | Ht 69.0 in | Wt 166.2 lb

## 2014-11-30 DIAGNOSIS — R0600 Dyspnea, unspecified: Secondary | ICD-10-CM | POA: Diagnosis not present

## 2014-11-30 DIAGNOSIS — R55 Syncope and collapse: Secondary | ICD-10-CM | POA: Diagnosis not present

## 2014-11-30 DIAGNOSIS — X088XXA Exposure to other specified smoke, fire and flames, initial encounter: Secondary | ICD-10-CM | POA: Insufficient documentation

## 2014-11-30 DIAGNOSIS — R9431 Abnormal electrocardiogram [ECG] [EKG]: Secondary | ICD-10-CM

## 2014-11-30 LAB — LIPID PANEL
CHOL/HDL RATIO: 5.4 ratio — AB (ref <=5.0)
Cholesterol: 200 mg/dL (ref 125–200)
HDL: 37 mg/dL — AB (ref >=40)
LDL Cholesterol: 129 mg/dL (ref <130)
Triglycerides: 171 mg/dL — ABNORMAL HIGH (ref <150)
VLDL: 34 mg/dL — ABNORMAL HIGH (ref <30)

## 2014-11-30 NOTE — Addendum Note (Signed)
Addended by: Channing MuttersGRACE, Graylin Sperling J on: 11/30/2014 12:23 PM   Modules accepted: Orders

## 2014-11-30 NOTE — Patient Instructions (Signed)
Medication Instructions:  The current medical regimen is effective;  continue present plan and medications.  Labwork: Please have blood work today. (Lipid)  Testing/Procedures: Your physician has requested that you have a myoview. For further information please visit [email or web address]. Please follow instruction sheet, as given.  Follow-Up: Follow up as needed with Dr Anne FuSkains.  Thank you for choosing [facility]!!
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
## 2014-12-02 ENCOUNTER — Telehealth (HOSPITAL_COMMUNITY): Payer: Self-pay | Admitting: *Deleted

## 2014-12-02 NOTE — Telephone Encounter (Signed)
Patient given detailed instructions per Myocardial Perfusion Study Information Sheet for the test on 12/07/14 at 730. Patient notified to arrive 15 minutes early and that it is imperative to arrive on time for appointment to keep from having the test rescheduled.  If you need to cancel or reschedule your appointment, please call the office within 24 hours of your appointment. Failure to do so may result in a cancellation of your appointment, and a $50 no show fee. Patient verbalized understanding.Kamillah Didonato J Shauntee Karp, RN  

## 2014-12-07 ENCOUNTER — Ambulatory Visit (HOSPITAL_COMMUNITY): Payer: 59 | Attending: Internal Medicine

## 2014-12-07 DIAGNOSIS — R0600 Dyspnea, unspecified: Secondary | ICD-10-CM | POA: Insufficient documentation

## 2014-12-07 DIAGNOSIS — R9431 Abnormal electrocardiogram [ECG] [EKG]: Secondary | ICD-10-CM | POA: Diagnosis not present

## 2014-12-07 DIAGNOSIS — R0602 Shortness of breath: Secondary | ICD-10-CM | POA: Insufficient documentation

## 2014-12-07 DIAGNOSIS — Z8249 Family history of ischemic heart disease and other diseases of the circulatory system: Secondary | ICD-10-CM | POA: Insufficient documentation

## 2014-12-07 DIAGNOSIS — R55 Syncope and collapse: Secondary | ICD-10-CM | POA: Insufficient documentation

## 2014-12-07 LAB — MYOCARDIAL PERFUSION IMAGING
CHL CUP MPHR: 161 {beats}/min
CHL CUP NUCLEAR SDS: 0
CHL CUP NUCLEAR SRS: 3
CHL CUP NUCLEAR SSS: 3
CHL CUP RESTING HR STRESS: 48 {beats}/min
Estimated workload: 8.5 METS
Exercise duration (min): 7 min
Exercise duration (sec): 0 s
LV sys vol: 46 mL
LVDIAVOL: 109 mL
NUC STRESS TID: 0.94
Peak HR: 150 {beats}/min
Percent HR: 93 %
RATE: 0.28

## 2014-12-07 MED ORDER — TECHNETIUM TC 99M SESTAMIBI GENERIC - CARDIOLITE
32.4000 | Freq: Once | INTRAVENOUS | Status: AC | PRN
  Administered 2014-12-07: 32 via INTRAVENOUS

## 2014-12-07 MED ORDER — TECHNETIUM TC 99M SESTAMIBI GENERIC - CARDIOLITE
11.0000 | Freq: Once | INTRAVENOUS | Status: AC | PRN
  Administered 2014-12-07: 11 via INTRAVENOUS

## 2014-12-10 ENCOUNTER — Telehealth: Payer: Self-pay | Admitting: Cardiology

## 2014-12-10 NOTE — Telephone Encounter (Signed)
-----   Message from Jake BatheMark C Skains, MD sent at 12/09/2014  6:45 AM EDT -----     Blood pressure demonstrated a hypertensive response to exercise.     Nuclear stress EF: 58%.     There was no ST segment deviation noted during stress.     The study is normal. There is no evidence of ischemia. The LV function is normal     This is a low risk study.  Reassuring Donato SchultzSKAINS, MARK, MD

## 2014-12-10 NOTE — Telephone Encounter (Signed)
Advised patient of lab and myoview results

## 2014-12-10 NOTE — Telephone Encounter (Signed)
New problem    Pt stated Dr Anne FuSkains called him last night. Please call pt.

## 2014-12-10 NOTE — Telephone Encounter (Signed)
-----   Message from Jake BatheMark C Skains, MD sent at 12/02/2014  9:06 AM EDT ----- LDL 129, triglycerides mildly elevated at 171. HDL slightly low. Continue with diet, exercise. No history of coronary disease. Awaiting stress test results. Donato SchultzSKAINS, MARK, MD

## 2015-11-27 ENCOUNTER — Emergency Department (HOSPITAL_BASED_OUTPATIENT_CLINIC_OR_DEPARTMENT_OTHER)
Admission: EM | Admit: 2015-11-27 | Discharge: 2015-11-27 | Disposition: A | Discharge status: Home / Self Care | Payer: 59 | Attending: Emergency Medicine | Admitting: Emergency Medicine

## 2015-11-27 ENCOUNTER — Emergency Department (HOSPITAL_BASED_OUTPATIENT_CLINIC_OR_DEPARTMENT_OTHER): Payer: 59

## 2015-11-27 ENCOUNTER — Encounter (HOSPITAL_BASED_OUTPATIENT_CLINIC_OR_DEPARTMENT_OTHER): Payer: Self-pay | Admitting: Emergency Medicine

## 2015-11-27 DIAGNOSIS — Y990 Civilian activity done for income or pay: Secondary | ICD-10-CM | POA: Insufficient documentation

## 2015-11-27 DIAGNOSIS — M20012 Mallet finger of left finger(s): Secondary | ICD-10-CM | POA: Diagnosis not present

## 2015-11-27 DIAGNOSIS — W230XXA Caught, crushed, jammed, or pinched between moving objects, initial encounter: Secondary | ICD-10-CM | POA: Diagnosis not present

## 2015-11-27 DIAGNOSIS — Y929 Unspecified place or not applicable: Secondary | ICD-10-CM | POA: Diagnosis not present

## 2015-11-27 DIAGNOSIS — Y9389 Activity, other specified: Secondary | ICD-10-CM | POA: Insufficient documentation

## 2015-11-27 DIAGNOSIS — S6992XA Unspecified injury of left wrist, hand and finger(s), initial encounter: Secondary | ICD-10-CM | POA: Diagnosis not present

## 2015-11-27 NOTE — ED Triage Notes (Signed)
L middle finger injury. Unable to straighten the top joint. Pt crushed it in between two boards at work last night.

## 2015-11-27 NOTE — ED Notes (Signed)
Patient transported to X-ray 
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
## 2015-11-27 NOTE — ED Notes (Signed)
PA at bedside.

## 2015-11-27 NOTE — Discharge Instructions (Signed)
Keep finger immobilized in splint at all times including sleep until you are seen by the hand surgeon. You will need to call them in the morning to schedule a follow up appointment.  Return to ER for new or worsening symptoms, any additional concerns.

## 2015-11-29 DIAGNOSIS — M20011 Mallet finger of right finger(s): Secondary | ICD-10-CM | POA: Diagnosis not present

## 2015-11-30 DIAGNOSIS — M20011 Mallet finger of right finger(s): Secondary | ICD-10-CM | POA: Diagnosis not present

## 2016-01-09 DIAGNOSIS — M20012 Mallet finger of left finger(s): Secondary | ICD-10-CM | POA: Diagnosis not present

## 2016-02-17 DIAGNOSIS — M20012 Mallet finger of left finger(s): Secondary | ICD-10-CM | POA: Diagnosis not present

## 2016-11-15 ENCOUNTER — Encounter (HOSPITAL_BASED_OUTPATIENT_CLINIC_OR_DEPARTMENT_OTHER): Payer: Self-pay | Admitting: *Deleted

## 2016-11-15 ENCOUNTER — Emergency Department (HOSPITAL_BASED_OUTPATIENT_CLINIC_OR_DEPARTMENT_OTHER)
Admission: EM | Admit: 2016-11-15 | Discharge: 2016-11-15 | Disposition: A | Discharge status: Home / Self Care | Payer: 59 | Attending: Physician Assistant | Admitting: Physician Assistant

## 2016-11-15 DIAGNOSIS — R42 Dizziness and giddiness: Secondary | ICD-10-CM | POA: Diagnosis not present

## 2016-11-15 DIAGNOSIS — I10 Essential (primary) hypertension: Secondary | ICD-10-CM | POA: Diagnosis not present

## 2016-11-15 LAB — BASIC METABOLIC PANEL
ANION GAP: 7 (ref 5–15)
BUN: 10 mg/dL (ref 6–20)
CHLORIDE: 106 mmol/L (ref 101–111)
CO2: 25 mmol/L (ref 22–32)
Calcium: 9.1 mg/dL (ref 8.9–10.3)
Creatinine, Ser: 1.02 mg/dL (ref 0.61–1.24)
GFR calc non Af Amer: >60 mL/min (ref >60)
Glucose, Bld: 91 mg/dL (ref 65–99)
Potassium: 3.8 mmol/L (ref 3.5–5.1)
SODIUM: 138 mmol/L (ref 135–145)

## 2016-11-15 LAB — URINALYSIS, ROUTINE W REFLEX MICROSCOPIC
Bilirubin Urine: NEGATIVE
GLUCOSE, UA: NEGATIVE mg/dL
Ketones, ur: NEGATIVE mg/dL
LEUKOCYTES UA: NEGATIVE
Nitrite: NEGATIVE
PROTEIN: NEGATIVE mg/dL
pH: 6 (ref 5.0–8.0)

## 2016-11-15 LAB — TROPONIN I: Troponin I: <0.03 ng/mL (ref <0.03)

## 2016-11-15 LAB — URINALYSIS, MICROSCOPIC (REFLEX)

## 2016-11-15 LAB — CBC
HCT: 43.3 % (ref 39.0–52.0)
HEMOGLOBIN: 15.1 g/dL (ref 13.0–17.0)
MCH: 31.9 pg (ref 26.0–34.0)
MCHC: 34.9 g/dL (ref 30.0–36.0)
MCV: 91.5 fL (ref 78.0–100.0)
PLATELETS: 297 10*3/uL (ref 150–400)
RBC: 4.73 MIL/uL (ref 4.22–5.81)
RDW: 12.9 % (ref 11.5–15.5)
WBC: 7.3 10*3/uL (ref 4.0–10.5)
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
## 2016-11-15 NOTE — ED Triage Notes (Signed)
Has not been feeling well. Blurred vision and felt faint a week ago. HTN this am.

## 2016-11-15 NOTE — ED Notes (Signed)
ED Provider at bedside. 

## 2016-11-15 NOTE — Discharge Instructions (Signed)
ED follow-up with primary care physician. You need to be seen and have your lipids checked as well as your blood pressure check.

## 2016-11-19 ENCOUNTER — Ambulatory Visit (INDEPENDENT_AMBULATORY_CARE_PROVIDER_SITE_OTHER): Payer: 59 | Admitting: Medical

## 2016-11-19 ENCOUNTER — Encounter: Payer: Self-pay | Admitting: Medical

## 2016-11-19 VITALS — BP 150/85 | HR 61 | Temp 98.4°F | Resp 16 | Ht 69.0 in | Wt 164.8 lb

## 2016-11-19 DIAGNOSIS — I10 Essential (primary) hypertension: Secondary | ICD-10-CM | POA: Diagnosis not present

## 2016-11-19 DIAGNOSIS — Z125 Encounter for screening for malignant neoplasm of prostate: Secondary | ICD-10-CM

## 2016-11-19 DIAGNOSIS — R5383 Other fatigue: Secondary | ICD-10-CM | POA: Diagnosis not present

## 2016-11-19 DIAGNOSIS — R319 Hematuria, unspecified: Secondary | ICD-10-CM | POA: Diagnosis not present

## 2016-11-19 DIAGNOSIS — F32A Depression, unspecified: Secondary | ICD-10-CM

## 2016-11-19 DIAGNOSIS — F329 Major depressive disorder, single episode, unspecified: Secondary | ICD-10-CM

## 2016-11-19 MED ORDER — FLUTICASONE PROPIONATE 50 MCG/ACT NA SUSP: 2.0000 | Freq: Every day | NASAL | 1 refills | Status: DC

## 2016-11-19 MED ORDER — LOSARTAN POTASSIUM 50 MG PO TABS: 50.0000 mg | ORAL_TABLET | Freq: Every day | ORAL | 0 refills | Status: DC

## 2016-11-19 NOTE — Progress Notes (Signed)
Subjective:    Patient ID: Jeremiah LIVESAYmale    DOB: [birth year], 61 y.o.   MRN: [id number]  HPI  Pt here for first time. He work for s [employer]. About 2 years close to retirement. He works [employer]. He works Stage manager twice a week. States he does not work out. Pt states moderate healthy diet. Nonsmoker, no alcohol.    Pt in for some recent high blood pressure. 172/110 and another time was 170/100 day he went to ED. On November 15, 2016 is when had high blood pressure. Then a week before he had high blood pressure reading transient scattered white specks in visual field. Most of time pt bp is in 130/80. Pt checks bp about one time a week. But this was before the high blood pressure readings.  Before he went to ED he was just feeling mild tired.  Pt states son past away 92 years old.(Pt son had hs of seizures and passed away overdose) Accidental overdose. Describes passed away in spring.  Some depression since son passed away. .  Pt had some trace blood in his urine past 2 times.  Pt took flu vaccine 1.5 weeks ago with [facility]  Some recent nasal congestion. Some fall allergies this time of the year.      Review of Systems  Constitutional: Positive for fatigue. Negative for chills, diaphoresis and fever.  HENT: Positive for congestion. Negative for facial swelling, nosebleeds, postnasal drip, rhinorrhea, sinus pain and sinus pressure.   Respiratory: Negative for cough, chest tightness, shortness of breath and wheezing.   Cardiovascular: Negative for chest pain and palpitations.  Gastrointestinal: Negative for abdominal distention, abdominal pain, blood in stool, diarrhea and nausea.  Genitourinary: Negative for decreased urine volume, difficulty urinating, dysuria, flank pain, genital sores, penile pain, scrotal swelling and testicular pain.  Musculoskeletal: Negative for back pain.  Skin: Negative for rash.  Neurological: Negative for dizziness, syncope,  speech difficulty, weakness, numbness and headaches.  Hematological: Negative for adenopathy. Does not bruise/bleed easily.  Psychiatric/Behavioral: Positive for dysphoric mood. Negative for confusion, hallucinations, sleep disturbance and suicidal ideas. The patient is nervous/anxious.     History reviewed. No pertinent past medical history.   Social History   Social History  . Marital status: Married    Spouse name: N/A  . Number of children: N/A  . Years of education: N/A   Occupational History  . Not on file.   Social History Main Topics  . Smoking status: Never Smoker  . Smokeless tobacco: Never Used  . Alcohol use No  . Drug use: No  . Sexual activity: Not on file   Other Topics Concern  . Not on file   Social History Narrative  . No narrative on file    Past Surgical History:  Procedure Laterality Date  . APPENDECTOMY      Family History  Problem Relation Age of Onset  . Parkinson's disease Mother   . Colon cancer Mother   . Breast cancer Mother   . Heart attack Father   . Colon cancer Maternal Aunt   . Colon cancer Maternal Uncle     Allergies  Allergen Reactions  . Penicillins     unknown    No current outpatient prescriptions on file prior to visit.   No current facility-administered medications on file prior to visit.     BP (!) 145/82   Pulse 61   Temp 98.4 F (36.9 C) (Oral)   Resp 16  Ht  (1.753 m)   Wt 164 lb 12.8 oz (74.8 kg)   SpO2 100%   BMI 24.34 kg/m       Objective:   Physical Exam  General Mental Status- Alert. General Appearance- Not in acute distress.   Skin General: Color- Normal Color. Moisture- Normal Moisture.  Neck Carotid Arteries- Normal color. Moisture- Normal Moisture. No carotid bruits. No JVD.  Chest and Lung Exam Auscultation: Breath Sounds:-Normal.  Cardiovascular Auscultation:Rythm- Regular. Murmurs & Other Heart Sounds:Auscultation of the heart reveals- No  Murmurs.  Abdomen Inspection:-Inspeection Normal. Palpation/Percussion:Note:No mass. Palpation and Percussion of the abdomen reveal- Non Tender, Non Distended + BS, no rebound or guarding.   HEENT Head- Normal. Ear Auditory Canal - Left- Normal. Right - Normal.Tympanic Membrane- Left- Normal. Right- Normal. Eye Sclera/Conjunctiva- Left- Normal. Right- Normal. Nose & Sinuses Nasal Mucosa- Left-  Boggy and Congested. Right-  Boggy and  Congested.Bilateral no maxillary and no frontal sinus pressure. Mouth & Throat Lips: Upper Lip- Normal: no dryness, cracking, pallor, cyanosis, or vesicular eruption. Lower Lip-Normal: no dryness, cracking, pallor, cyanosis or vesicular eruption. Buccal Mucosa- Bilateral- No Aphthous ulcers. Oropharynx- No Discharge or Erythema. Tonsils: Characteristics- Bilateral- No Erythema or Congestion. Size/Enlargement- Bilateral- No enlargement. Discharge- bilateral-None.         Assessment & Plan:  For recent hypertension, I want you to start losartan 50 mg tablet and recheck blood pressure in 2 weeks. You might need a higher dose.  For your probable depression, I want you to consider possibly using medication called Effexor and possible referral to counselor. You could do one or both if you choose.   For fatigue will put in future labs and to check thyroid, B12 and B1 level. Recent CBC and CMP were normal.   Also when you get future labs done will check PSA. In addition please make sure your fasting since I want to check your cholesterol in light of your family/dad heart history.   Also for probable allergies rx flonase.  Follow-up in 2 weeks or as needed.   Elzada Pytel, Ramon Dredge, PA-C

## 2016-11-19 NOTE — Patient Instructions (Addendum)
For recent hypertension, I want you to start losartan 50 mg tablet and recheck blood pressure in 2 weeks. You might need a higher dose.  For your probable depression, I want you to consider possibly using medication called Effexor and possible referral to counselor. You could do one or both if you choose.   For fatigue will put in future labs and to check thyroid, B12 and B1 level. Recent CBC and CMP were normal.   Also when you get future labs done will check PSA. In addition please make sure your fasting since I want to check your cholesterol in light of your family/dad heart history.   Also for probable allergies. Rx flonase.  Follow-up in 2 weeks or as needed.

## 2016-12-07 ENCOUNTER — Ambulatory Visit: Payer: 59 | Admitting: Medical

## 2016-12-12 ENCOUNTER — Encounter: Payer: Self-pay | Admitting: Medical

## 2016-12-12 ENCOUNTER — Ambulatory Visit (INDEPENDENT_AMBULATORY_CARE_PROVIDER_SITE_OTHER): Payer: 59 | Admitting: Medical

## 2016-12-12 VITALS — BP 134/72 | HR 56 | Temp 98.2°F | Resp 16 | Ht 69.0 in | Wt 166.4 lb

## 2016-12-12 DIAGNOSIS — R5383 Other fatigue: Secondary | ICD-10-CM | POA: Diagnosis not present

## 2016-12-12 DIAGNOSIS — Z1211 Encounter for screening for malignant neoplasm of colon: Secondary | ICD-10-CM | POA: Diagnosis not present

## 2016-12-12 DIAGNOSIS — I10 Essential (primary) hypertension: Secondary | ICD-10-CM

## 2016-12-12 DIAGNOSIS — F32A Depression, unspecified: Secondary | ICD-10-CM

## 2016-12-12 DIAGNOSIS — R319 Hematuria, unspecified: Secondary | ICD-10-CM

## 2016-12-12 DIAGNOSIS — F329 Major depressive disorder, single episode, unspecified: Secondary | ICD-10-CM

## 2016-12-12 DIAGNOSIS — Z125 Encounter for screening for malignant neoplasm of prostate: Secondary | ICD-10-CM | POA: Diagnosis not present

## 2016-12-12 LAB — LIPID PANEL
Cholesterol: 186 mg/dL (ref 0–200)
HDL: 36.5 mg/dL — AB (ref >39.00)
LDL Cholesterol: 116 mg/dL — ABNORMAL HIGH (ref 0–99)
NonHDL: 149.45
Total CHOL/HDL Ratio: 5
Triglycerides: 165 mg/dL — ABNORMAL HIGH (ref 0.0–149.0)
VLDL: 33 mg/dL (ref 0.0–40.0)

## 2016-12-12 LAB — POC URINALSYSI DIPSTICK (AUTOMATED)
BILIRUBIN UA: NEGATIVE
Blood, UA: NEGATIVE
Glucose, UA: NEGATIVE
KETONES UA: NEGATIVE
LEUKOCYTES UA: NEGATIVE
Nitrite, UA: NEGATIVE
Protein, UA: NEGATIVE
Spec Grav, UA: >=1.03 — AB (ref 1.010–1.025)
Urobilinogen, UA: 0.2 E.U./dL
pH, UA: 6 (ref 5.0–8.0)

## 2016-12-12 LAB — VITAMIN B12: VITAMIN B 12: 179 pg/mL — AB (ref 211–911)

## 2016-12-12 LAB — PSA: PSA: 0.8 ng/mL (ref 0.10–4.00)

## 2016-12-12 LAB — T4, FREE: Free T4: 0.83 ng/dL (ref 0.60–1.60)

## 2016-12-12 LAB — TSH: TSH: 3.19 u[IU]/mL (ref 0.35–4.50)

## 2016-12-12 MED ORDER — LOSARTAN POTASSIUM 50 MG PO TABS: 50.0000 mg | ORAL_TABLET | Freq: Every day | ORAL | 1 refills | Status: DC

## 2016-12-12 NOTE — Patient Instructions (Addendum)
Bp controlled today. Good reading so will continue losartan.  For fatigue history please get labs scheduled on last visit today.  For mood will follow you. As you report mood decreased likely from work stress. 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  Referral to Gi placed   Follow up in 3-6 months or as needed

## 2016-12-12 NOTE — Progress Notes (Signed)
Subjective:    Patient ID: Jeremiah Mitchell, male    DOB: 1955/02/15, 61 y.o.   MRN: [id number]  HPI  Pt in states feeling better with bp med. NO side effects. BP controlled.   Pt mood is better today. He retired today. He thinks mood decreased was related to  Work stress. No anxiety.  Pt had some labs from last visit placed for fatigue. He never got labs done but will due today.  Pt did flu vaccine [facility].  Review of Systems  Constitutional: Positive for fatigue. Negative for chills and fever.  HENT: Negative for congestion, drooling and ear pain.   Respiratory: Negative for cough, chest tightness, shortness of breath and wheezing.   Cardiovascular: Negative for chest pain and palpitations.  Gastrointestinal: Negative for abdominal distention, anal bleeding, blood in stool, nausea and vomiting.  Genitourinary: Negative for dysuria, flank pain and testicular pain.  Musculoskeletal: Negative for back pain, myalgias and neck stiffness.  Skin: Negative for color change and rash.  Neurological: Negative for dizziness, weakness, numbness and headaches.  Hematological: Negative for adenopathy. Does not bruise/bleed easily.  Psychiatric/Behavioral: Negative for behavioral problems and confusion.   Past Medical History:  Diagnosis Date  . Allergy   . Depression   . Hypertension      Social History   Social History  . Marital status: Married    Spouse name: N/A  . Number of children: N/A  . Years of education: N/A   Occupational History  . Not on file.   Social History Main Topics  . Smoking status: Never Smoker  . Smokeless tobacco: Never Used  . Alcohol use No  . Drug use: No  . Sexual activity: Yes   Other Topics Concern  . Not on file   Social History Narrative  . No narrative on file    Past Surgical History:  Procedure Laterality Date  . APPENDECTOMY      Family History  Problem Relation Age of Onset  . Parkinson's disease Mother   . Colon  cancer Mother   . Breast cancer Mother   . Heart attack Father   . Colon cancer Maternal Aunt   . Colon cancer Maternal Uncle     Allergies  Allergen Reactions  . Penicillins     unknown    Current Outpatient Prescriptions on File Prior to Visit  Medication Sig Dispense Refill  . fluticasone (FLONASE) 50 MCG/ACT nasal spray Place 2 sprays into both nostrils daily. 16 g 1  . losartan (COZAAR) 50 MG tablet Take 1 tablet (50 mg total) by mouth daily. 30 tablet 0   No current facility-administered medications on file prior to visit.     BP 134/72   Pulse (!) 56   Temp 98.2 F (36.8 C) (Oral)   Resp 16   Ht 5\' 9"  (1.753 m)   Wt 166 lb 6.4 oz (75.5 kg)   SpO2 99%   BMI 24.57 kg/m       Objective:   Physical Exam  General Mental Status- Alert. General Appearance- Not in acute distress.   Skin General: Color- Normal Color. Moisture- Normal Moisture.  Neck Carotid Arteries- Normal color. Moisture- Normal Moisture. No carotid bruits. No JVD.  Chest and Lung Exam Auscultation: Breath Sounds:-Normal.  Cardiovascular Auscultation:Rythm- Regular. Murmurs & Other Heart Sounds:Auscultation of the heart reveals- No Murmurs.  Neurologic Cranial Nerve exam:- CN III-XII intact(No nystagmus), symmetric smile. Strength:- 5/5 equal and symmetric strength both upper and lower extremities.  Assessment & Plan:  Bp controlled today. Good reading so will continue losartan.  For fatigue history please get labs scheduled on last visit today.  For mood will follow you. As you report mood decreased likely from work stress. 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  Referral to Gi placed   Follow up in 3-6 months or as needed  Ramir Malerba, Ramon DredgeEdward, VF CorporationPA-C

## 2016-12-14 ENCOUNTER — Telehealth: Payer: Self-pay | Admitting: Medical

## 2016-12-14 NOTE — Telephone Encounter (Signed)
Patient has a low B12 level.  Please arrange for him to have nurse visit/B12 injection every month for the next 5 months.

## 2016-12-18 LAB — VITAMIN B1: VITAMIN B1 (THIAMINE): 7 nmol/L — AB (ref 8–30)

## 2016-12-18 NOTE — Telephone Encounter (Signed)
Left pt a message to call back. 

## 2016-12-24 NOTE — Telephone Encounter (Signed)
Left a message for pt to call back

## 2016-12-24 NOTE — Telephone Encounter (Signed)
°  Relation to pt: self  Call back number:[phone] Pharmacy:  [id number] - [place], [place] - [address] [phone] (Phone) [phone])    Reason for call:  Patient requesting B12 script please send to pharmacy, patient states cost will be cheaper.   Patient would like to also know how PCP would feel about sending B12 tablets to pharmacy so he wouldn't have to come to the office and just take at home, please advise

## 2016-12-25 ENCOUNTER — Telehealth: Payer: Self-pay | Admitting: Medical

## 2016-12-25 MED ORDER — CYANOCOBALAMIN 1000 MCG/ML IJ SOLN: 1000.0000 ug | INTRAMUSCULAR | 0 refills | Status: DC

## 2016-12-25 NOTE — Telephone Encounter (Signed)
Prescription of B12 sent to patient's pharmacy.  We will see if his insurance covers the prescription.  Notify patient that I want to repeat B12 level in the 5 months.

## 2016-12-26 ENCOUNTER — Telehealth: Payer: Self-pay | Admitting: Medical

## 2016-12-26 DIAGNOSIS — I10 Essential (primary) hypertension: Secondary | ICD-10-CM

## 2016-12-26 MED ORDER — LOSARTAN POTASSIUM 50 MG PO TABS: 50.0000 mg | ORAL_TABLET | Freq: Every day | ORAL | 1 refills | Status: DC

## 2016-12-26 NOTE — Telephone Encounter (Signed)
Jeremiah Mitchell, Jeremiah DredgeEdward, PA-C routed this conversation to Lurline Harearter, Karen E, LPN  Jeremiah Mitchell, Jeremiah Mcdward, PA-C      12/25/16 1:22 PM  Note    Prescription of B12 sent to patient's pharmacy.  We will see if his insurance covers the prescription.  Notify patient that I want to repeat B12 level in the 5 months.

## 2016-12-26 NOTE — Telephone Encounter (Signed)
[id number] [place], [place] - [address] [phone] (Phone) [phone])    Reason for call:  [facility] all maintenance drugs have to go to   [facility] [address], [place], [place] [address] [phone]  *patient requesting losartan (COZAAR) 50 MG tablet please send to   [facility] [address], [place], [place] [address] [phone]

## 2016-12-26 NOTE — Telephone Encounter (Addendum)
Relation to UV:OZDGpt:self Call back number:[phone]   Reason for call:  Patient scheduled B12 injection with nurse on Monday 12/31/16 at 9:30am (patient states he received approval to have b12 injections on Monday only due to work conflicts). Patient informed B12 injection is currently at pharmacy for pick up.

## 2016-12-26 NOTE — Telephone Encounter (Signed)
Rx sent to pharmacy   

## 2016-12-28 ENCOUNTER — Telehealth: Payer: Self-pay | Admitting: *Deleted

## 2016-12-28 NOTE — Telephone Encounter (Signed)
Received request for Medical Records from [email or web address] c/o [employer]; forwarded to [place] for email/scana/SLS 11/16

## 2016-12-31 ENCOUNTER — Telehealth: Payer: Self-pay | Admitting: Medical

## 2016-12-31 ENCOUNTER — Other Ambulatory Visit: Payer: Self-pay

## 2016-12-31 ENCOUNTER — Ambulatory Visit (INDEPENDENT_AMBULATORY_CARE_PROVIDER_SITE_OTHER): Payer: 59

## 2016-12-31 DIAGNOSIS — E538 Deficiency of other specified B group vitamins: Secondary | ICD-10-CM | POA: Diagnosis not present

## 2016-12-31 MED ORDER — CYANOCOBALAMIN 1000 MCG/ML IJ SOLN: 1000.0000 ug | INTRAMUSCULAR | 0 refills | Status: DC

## 2016-12-31 MED ORDER — CYANOCOBALAMIN 1000 MCG/ML IJ SOLN
1000.0000 ug | Freq: Once | INTRAMUSCULAR | Status: AC
  Administered 2016-12-31: 1000 ug via INTRAMUSCULAR

## 2016-12-31 NOTE — Telephone Encounter (Signed)
Pt has to have monthly b-12 injections. Pt says due to his work schedule he have to have Monday apt, received the verbal okay from nurse Clydie BraunKaren that it is okay to schedule pt Monday b-12 injection apt.

## 2016-12-31 NOTE — Progress Notes (Signed)
Pre visit review using our clinic tool,if applicable. No additional management support is needed unless otherwise documented below in the visit note.  Patient in for B12 injection per order from E. Saguier, PA-Cmdue to patient having B12 deficiency.  Given 1000 mcg IM left deltoid.  Patient tolerated well. Return appointment scheduled for 1 month.

## 2017-01-28 ENCOUNTER — Ambulatory Visit (INDEPENDENT_AMBULATORY_CARE_PROVIDER_SITE_OTHER): Payer: 59

## 2017-01-28 DIAGNOSIS — E538 Deficiency of other specified B group vitamins: Secondary | ICD-10-CM

## 2017-01-28 MED ORDER — CYANOCOBALAMIN 1000 MCG/ML IJ SOLN
1000.0000 ug | Freq: Once | INTRAMUSCULAR | Status: AC
  Administered 2017-01-28: 1000 ug via INTRAMUSCULAR

## 2017-01-28 NOTE — Progress Notes (Signed)
I reviewed B12 injection request today.  He does have B12 deficiency history so he can get an injection for that.

## 2017-01-29 ENCOUNTER — Ambulatory Visit: Payer: 59

## 2017-02-26 ENCOUNTER — Ambulatory Visit (INDEPENDENT_AMBULATORY_CARE_PROVIDER_SITE_OTHER): Payer: 59

## 2017-02-26 DIAGNOSIS — E538 Deficiency of other specified B group vitamins: Secondary | ICD-10-CM | POA: Diagnosis not present

## 2017-02-26 MED ORDER — CYANOCOBALAMIN 1000 MCG/ML IJ SOLN
1000.0000 ug | Freq: Once | INTRAMUSCULAR | Status: AC
  Administered 2017-02-26: 1000 ug via INTRAMUSCULAR

## 2017-02-26 NOTE — Progress Notes (Addendum)
Pre visit review using our clinic tool,if applicable. No additional management support is needed unless otherwise documented below in the visit note.   Patient in for B12 injection per order from Edwena BundeEdward Saguier,PA due to patient having B12 deficiency.  Given 1000 mcg IM left deltoid. Patient tolerated well.   Patient will schedule next months injection at front desk.   Agree with the administration of b12 for low b12 indication.  Saguier, Ramon DredgeEdward, PA-C

## 2017-04-02 ENCOUNTER — Ambulatory Visit (INDEPENDENT_AMBULATORY_CARE_PROVIDER_SITE_OTHER): Payer: 59 | Admitting: *Deleted

## 2017-04-02 DIAGNOSIS — E538 Deficiency of other specified B group vitamins: Secondary | ICD-10-CM

## 2017-04-02 MED ORDER — CYANOCOBALAMIN 1000 MCG/ML IJ SOLN
1000.0000 ug | Freq: Once | INTRAMUSCULAR | Status: AC
  Administered 2017-04-02: 1000 ug via INTRAMUSCULAR

## 2017-04-02 NOTE — Progress Notes (Addendum)
Pre visit review using our clinic review tool, if applicable. No additional management support is needed unless otherwise documented below in the visit note.   Pt here for B12 injection per order from PCP in 12/14/16 phone note.  Injection given in right deltoid and pt tolerated well.  Scheduled pt for next b12 injection for 05/01/17 at 9am and he is due for repeat B12 level at that visit. Future order placed and lab appt scheduled.  Agree with administration of B12 for the indication of low B12.  Esperanza RichtersEdward Saguier, PA-C

## 2017-04-02 NOTE — Patient Instructions (Signed)
Please return for your next B 12 injection on 05/01/17 at 9am and we will repeat your B12 level immediately following your nurse visit.

## 2017-05-01 ENCOUNTER — Ambulatory Visit (INDEPENDENT_AMBULATORY_CARE_PROVIDER_SITE_OTHER): Payer: 59 | Admitting: *Deleted

## 2017-05-01 ENCOUNTER — Other Ambulatory Visit (INDEPENDENT_AMBULATORY_CARE_PROVIDER_SITE_OTHER): Payer: 59

## 2017-05-01 DIAGNOSIS — E538 Deficiency of other specified B group vitamins: Secondary | ICD-10-CM

## 2017-05-01 LAB — VITAMIN B12

## 2017-05-01 MED ORDER — CYANOCOBALAMIN 1000 MCG/ML IJ SOLN
1000.0000 ug | Freq: Once | INTRAMUSCULAR | Status: AC
  Administered 2017-05-01: 1000 ug via INTRAMUSCULAR

## 2017-05-01 NOTE — Progress Notes (Addendum)
Pre visit review using our clinic review tool, if applicable. No additional management support is needed unless otherwise documented below in the visit note.  Pt here for B12 injection per Esperanza RichtersEdward Saguier, PA-C.  B12 1000 mcg given IM, left deltoid and pt tolerated injection well.  Next B12 appt pending B12 level that is being drawn today. Advised pt he will be contacted if further injections need to be scheduled once labs are reviewed.  With administration of B12 injection for diagnosis of low B12.  Plan was to get him 5 months of B12 injection and repeat B12 level at 6 months.  Esperanza RichtersEdward Saguier, PA-C

## 2017-06-30 DIAGNOSIS — H524 Presbyopia: Secondary | ICD-10-CM | POA: Diagnosis not present

## 2017-11-16 ENCOUNTER — Encounter

## 2021-01-03 ENCOUNTER — Other Ambulatory Visit: Payer: Self-pay

## 2021-01-03 ENCOUNTER — Encounter: Payer: Self-pay | Admitting: Internal Medicine

## 2021-01-03 ENCOUNTER — Ambulatory Visit (INDEPENDENT_AMBULATORY_CARE_PROVIDER_SITE_OTHER): Payer: Medicare Other | Admitting: Internal Medicine

## 2021-01-03 VITALS — BP 170/88 | HR 53 | Temp 98.3°F | Resp 16 | Ht 69.0 in | Wt 172.0 lb

## 2021-01-03 DIAGNOSIS — Z0001 Encounter for general adult medical examination with abnormal findings: Secondary | ICD-10-CM | POA: Diagnosis not present

## 2021-01-03 DIAGNOSIS — D510 Vitamin B12 deficiency anemia due to intrinsic factor deficiency: Secondary | ICD-10-CM | POA: Diagnosis not present

## 2021-01-03 DIAGNOSIS — I10 Essential (primary) hypertension: Secondary | ICD-10-CM

## 2021-01-03 DIAGNOSIS — E785 Hyperlipidemia, unspecified: Secondary | ICD-10-CM | POA: Diagnosis not present

## 2021-01-03 DIAGNOSIS — Z23 Encounter for immunization: Secondary | ICD-10-CM | POA: Insufficient documentation

## 2021-01-03 DIAGNOSIS — N400 Enlarged prostate without lower urinary tract symptoms: Secondary | ICD-10-CM

## 2021-01-03 DIAGNOSIS — Z1211 Encounter for screening for malignant neoplasm of colon: Secondary | ICD-10-CM | POA: Insufficient documentation

## 2021-01-03 DIAGNOSIS — E538 Deficiency of other specified B group vitamins: Secondary | ICD-10-CM

## 2021-01-03 LAB — URINALYSIS, ROUTINE W REFLEX MICROSCOPIC
Bilirubin Urine: NEGATIVE
Hgb urine dipstick: NEGATIVE
Ketones, ur: NEGATIVE
Leukocytes,Ua: NEGATIVE
Nitrite: NEGATIVE
RBC / HPF: NONE SEEN (ref 0–?)
Specific Gravity, Urine: 1.01 (ref 1.000–1.030)
Total Protein, Urine: NEGATIVE
Urine Glucose: NEGATIVE
Urobilinogen, UA: 0.2 (ref 0.0–1.0)
WBC, UA: NONE SEEN (ref 0–?)
pH: 6 (ref 5.0–8.0)

## 2021-01-03 LAB — BASIC METABOLIC PANEL
BUN: 11 mg/dL (ref 6–23)
CO2: 29 mEq/L (ref 19–32)
Calcium: 9.4 mg/dL (ref 8.4–10.5)
Chloride: 104 mEq/L (ref 96–112)
Creatinine, Ser: 1.21 mg/dL (ref 0.40–1.50)
GFR: 62.94 mL/min (ref >60.00)
Glucose, Bld: 96 mg/dL (ref 70–99)
Potassium: 4 mEq/L (ref 3.5–5.1)
Sodium: 140 mEq/L (ref 135–145)

## 2021-01-03 LAB — CBC WITH DIFFERENTIAL/PLATELET
Basophils Absolute: 0 10*3/uL (ref 0.0–0.1)
Basophils Relative: 0.5 % (ref 0.0–3.0)
Eosinophils Absolute: 0.1 10*3/uL (ref 0.0–0.7)
Eosinophils Relative: 1.4 % (ref 0.0–5.0)
HCT: 44.1 % (ref 39.0–52.0)
Hemoglobin: 15.1 g/dL (ref 13.0–17.0)
Lymphocytes Relative: 26.5 % (ref 12.0–46.0)
Lymphs Abs: 1.4 10*3/uL (ref 0.7–4.0)
MCHC: 34.3 g/dL (ref 30.0–36.0)
MCV: 91.8 fl (ref 78.0–100.0)
Monocytes Absolute: 0.4 10*3/uL (ref 0.1–1.0)
Monocytes Relative: 8 % (ref 3.0–12.0)
Neutro Abs: 3.3 10*3/uL (ref 1.4–7.7)
Neutrophils Relative %: 63.6 % (ref 43.0–77.0)
Platelets: 276 10*3/uL (ref 150.0–400.0)
RBC: 4.81 Mil/uL (ref 4.22–5.81)
RDW: 13.2 % (ref 11.5–15.5)
WBC: 5.2 10*3/uL (ref 4.0–10.5)

## 2021-01-03 LAB — LIPID PANEL
Cholesterol: 220 mg/dL — ABNORMAL HIGH (ref 0–200)
HDL: 46.2 mg/dL (ref >39.00)
LDL Cholesterol: 159 mg/dL — ABNORMAL HIGH (ref 0–99)
NonHDL: 173.56
Total CHOL/HDL Ratio: 5
Triglycerides: 72 mg/dL (ref 0.0–149.0)
VLDL: 14.4 mg/dL (ref 0.0–40.0)

## 2021-01-03 LAB — HEPATIC FUNCTION PANEL
ALT: 13 U/L (ref 0–53)
AST: 14 U/L (ref 0–37)
Albumin: 4.4 g/dL (ref 3.5–5.2)
Alkaline Phosphatase: 64 U/L (ref 39–117)
Bilirubin, Direct: 0.2 mg/dL (ref 0.0–0.3)
Total Bilirubin: 0.7 mg/dL (ref 0.2–1.2)
Total Protein: 7.3 g/dL (ref 6.0–8.3)

## 2021-01-03 LAB — PSA: PSA: 1.05 ng/mL (ref 0.10–4.00)

## 2021-01-03 LAB — FOLATE: Folate: 5.3 ng/mL — ABNORMAL LOW (ref >5.9)

## 2021-01-03 LAB — VITAMIN B12: Vitamin B-12: 203 pg/mL — ABNORMAL LOW (ref 211–911)

## 2021-01-03 LAB — TSH: TSH: 4.29 u[IU]/mL (ref 0.35–5.50)

## 2021-01-03 MED ORDER — INDAPAMIDE 1.25 MG PO TABS
1.2500 mg | ORAL_TABLET | Freq: Every day | ORAL | 0 refills | Status: DC
Start: 2021-01-03 — End: 2021-02-22

## 2021-01-03 MED ORDER — ROSUVASTATIN CALCIUM 20 MG PO TABS: 20.0000 mg | ORAL_TABLET | Freq: Every day | ORAL | 1 refills | Status: DC

## 2021-01-03 MED ORDER — OLMESARTAN MEDOXOMIL 20 MG PO TABS: 20.0000 mg | ORAL_TABLET | Freq: Every day | ORAL | 0 refills | Status: DC

## 2021-01-03 MED ORDER — FOLIC ACID 1 MG PO TABS: 1.0000 mg | ORAL_TABLET | Freq: Every day | ORAL | 1 refills | Status: DC

## 2021-01-03 MED ORDER — BOOSTRIX 5-2.5-18.5 LF-MCG/0.5 IM SUSP: 0.5000 mL | Freq: Once | INTRAMUSCULAR | 0 refills | Status: AC

## 2021-01-03 NOTE — Progress Notes (Signed)
Subjective:  Patient ID: Jeremiah Mitchell, male    DOB: 1956/01/29  Age: 65 y.o. MRN: [id number]  CC: Annual Exam, Hypertension, and Hyperlipidemia  This visit occurred during the SARS-CoV-2 public health emergency.  Safety protocols were in place, including screening questions prior to the visit, additional usage of staff PPE, and extensive cleaning of exam room while observing appropriate contact time as indicated for disinfecting solutions.    HPI [name] presents for a CPX and to establish.  He is very active and denies chest pain, shortness of breath, headache, blurred vision, dizziness, lightheadedness, or edema.  He does complain of chronic fatigue.  History [name] has a past medical history of Allergy, Depression, and Hypertension.   He has a past surgical history that includes Appendectomy.   His family history includes Breast cancer in his mother; Colon cancer in his maternal aunt, maternal uncle, and mother; Heart attack in his father; Parkinson's disease in his mother.He reports that he has never smoked. He has never used smokeless tobacco. He reports that he does not drink alcohol and does not use drugs.  Outpatient Medications Prior to Visit  Medication Sig Dispense Refill   cyanocobalamin (,VITAMIN B-12,) 1000 MCG/ML injection Inject 1 mL (1,000 mcg total) every 30 (thirty) days into the muscle. 10 mL 0   fluticasone (FLONASE) 50 MCG/ACT nasal spray Place 2 sprays into both nostrils daily. 16 g 1   losartan (COZAAR) 50 MG tablet Take 1 tablet (50 mg total) daily by mouth. 90 tablet 1   No facility-administered medications prior to visit.    ROS Review of Systems  Constitutional:  Positive for fatigue. Negative for appetite change, chills, diaphoresis, fever and unexpected weight change.  HENT: Negative.    Eyes: Negative.   Respiratory:  Negative for cough, chest tightness, shortness of breath and wheezing.   Cardiovascular:  Negative for chest pain,  palpitations and leg swelling.  Gastrointestinal:  Negative for abdominal pain, constipation, diarrhea, nausea and vomiting.  Endocrine: Negative.   Genitourinary: Negative.  Negative for difficulty urinating, dysuria, penile swelling and scrotal swelling.  Musculoskeletal: Negative.  Negative for arthralgias.  Skin: Negative.  Negative for color change and pallor.  Neurological:  Negative for dizziness, syncope, weakness, light-headedness and numbness.  Hematological:  Negative for adenopathy. Does not bruise/bleed easily.  Psychiatric/Behavioral: Negative.     Objective:  BP (!) 170/88 (BP Location: Left Arm, Patient Position: Sitting, Cuff Size: Large) Comment: BP (R) 160/86 (L) 170/88  Pulse (!) 53   Temp 98.3 F (36.8 C) (Oral)   Resp 16   Ht 5\' 9"  (1.753 m)   Wt 172 lb (78 kg)   SpO2 98%   BMI 25.40 kg/m   Physical Exam Vitals reviewed.  HENT:     Nose: Nose normal.     Mouth/Throat:     Mouth: Mucous membranes are moist.  Eyes:     General: No scleral icterus.    Conjunctiva/sclera: Conjunctivae normal.  Cardiovascular:     Rate and Rhythm: Bradycardia present.     Heart sounds: No murmur heard.   No friction rub. No gallop.     Comments: EKG- Sinus bradycardia, 55 bpm No LVH or Q waves Pulmonary:     Effort: Pulmonary effort is normal.     Breath sounds: No stridor. No wheezing, rhonchi or rales.  Abdominal:     General: Abdomen is flat.     Palpations: There is no mass.     Tenderness: There  is no abdominal tenderness. There is no guarding or rebound.     Hernia: No hernia is present. There is no hernia in the left inguinal area or right inguinal area.  Genitourinary:    Pubic Area: No rash.      Penis: Normal and circumcised.      Testes: Normal.        Right: Mass not present.        Left: Mass not present.     Epididymis:     Right: Normal. Not enlarged. No mass.     Left: Normal. Not enlarged. No mass.     Prostate: Enlarged. Not tender and no  nodules present.     Rectum: Normal. Guaiac result negative. No mass, tenderness, anal fissure, external hemorrhoid or internal hemorrhoid. Normal anal tone.  Musculoskeletal:     Cervical back: Neck supple.     Right lower leg: No edema.     Left lower leg: No edema.  Lymphadenopathy:     Cervical: No cervical adenopathy.     Lower Body: No right inguinal adenopathy. No left inguinal adenopathy.  Skin:    General: Skin is warm and dry.  Neurological:     General: No focal deficit present.     Mental Status: He is alert.  Psychiatric:        Behavior: Behavior normal.    Lab Results  Component Value Date   WBC 5.2 01/03/2021   HGB 15.1 01/03/2021   HCT 44.1 01/03/2021   PLT 276.0 01/03/2021   GLUCOSE 96 01/03/2021   CHOL 220 (H) 01/03/2021   TRIG 72.0 01/03/2021   HDL 46.20 01/03/2021   LDLCALC 159 (H) 01/03/2021   ALT 13 01/03/2021   AST 14 01/03/2021   NA 140 01/03/2021   K 4.0 01/03/2021   CL 104 01/03/2021   CREATININE 1.21 01/03/2021   BUN 11 01/03/2021   CO2 29 01/03/2021   TSH 4.29 01/03/2021   PSA 1.05 01/03/2021     Assessment & Plan:   Crystal was seen today for annual exam, hypertension and hyperlipidemia.  Diagnoses and all orders for this visit:  Primary hypertension- He has stage II-III hypertension.  Will check labs to screen for endorgan damage and secondary causes.  I recommended that he start taking an ARB and thiazide diuretic. -     CBC with Differential/Platelet; Future -     Basic metabolic panel; Future -     Aldosterone + renin activity w/ ratio; Future -     Urinalysis, Routine w reflex microscopic; Future -     EKG 12-Lead -     Urinalysis, Routine w reflex microscopic -     Aldosterone + renin activity w/ ratio -     Basic metabolic panel -     CBC with Differential/Platelet -     olmesartan (BENICAR) 20 MG tablet; Take 1 tablet (20 mg total) by mouth daily. -     indapamide (LOZOL) 1.25 MG tablet; Take 1 tablet (1.25 mg total) by  mouth daily.  Benign prostatic hyperplasia without lower urinary tract symptoms- His PSA is low and he has no symptoms. -     PSA; Future -     Urinalysis, Routine w reflex microscopic; Future -     Urinalysis, Routine w reflex microscopic -     PSA  Hyperlipidemia LDL goal <130- He has an elevated ASCVD risk score.  I have asked him to take a statin for CV risk reduction. -  Lipid panel; Future -     Hepatic function panel; Future -     TSH; Future -     TSH -     Hepatic function panel -     Lipid panel -     rosuvastatin (CRESTOR) 20 MG tablet; Take 1 tablet (20 mg total) by mouth daily.  Encounter for general adult medical examination with abnormal findings- Exam completed, labs reviewed, vaccines reviewed and updated, cancer screenings addressed, patient education material was given.  Vitamin B12 deficiency anemia due to intrinsic factor deficiency- His B12 is normal. -     CBC with Differential/Platelet; Future -     Vitamin B12; Future -     Folate; Future -     Folate -     Vitamin B12 -     CBC with Differential/Platelet  Screen for colon cancer -     Cologuard  Need for prophylactic vaccination with combined diphtheria-tetanus-pertussis (DTP) vaccine -     Tdap (BOOSTRIX) 5-2.5-18.5 LF-MCG/0.5 injection; Inject 0.5 mLs into the muscle once for 1 dose.  Need for immunization against influenza -     Flu Vaccine QUAD High Dose(Fluad)  Need for vaccination -     Pneumococcal conjugate vaccine 20-valent (Prevnar 20)  Dietary folate deficiency -     folic acid (FOLVITE) 1 MG tablet; Take 1 tablet (1 mg total) by mouth daily.  I have discontinued Zaven Klemens. Hawker's fluticasone, losartan, and cyanocobalamin. I am also having him start on Boostrix, folic acid, olmesartan, indapamide, and rosuvastatin.  Meds ordered this encounter  Medications   Tdap (BOOSTRIX) 5-2.5-18.5 LF-MCG/0.5 injection    Sig: Inject 0.5 mLs into the muscle once for 1 dose.    Dispense:   0.5 mL    Refill:  0   folic acid (FOLVITE) 1 MG tablet    Sig: Take 1 tablet (1 mg total) by mouth daily.    Dispense:  90 tablet    Refill:  1   olmesartan (BENICAR) 20 MG tablet    Sig: Take 1 tablet (20 mg total) by mouth daily.    Dispense:  90 tablet    Refill:  0   indapamide (LOZOL) 1.25 MG tablet    Sig: Take 1 tablet (1.25 mg total) by mouth daily.    Dispense:  90 tablet    Refill:  0   rosuvastatin (CRESTOR) 20 MG tablet    Sig: Take 1 tablet (20 mg total) by mouth daily.    Dispense:  90 tablet    Refill:  1     Follow-up: Return in about 3 months (around 04/05/2021).  Sanda Linger, MD

## 2021-01-03 NOTE — Patient Instructions (Signed)

## 2021-01-14 LAB — ALDOSTERONE + RENIN ACTIVITY W/ RATIO
ALDO / PRA Ratio: 10 Ratio (ref 0.9–28.9)
Aldosterone: 3 ng/dL
Renin Activity: 0.3 ng/mL/h (ref 0.25–5.82)

## 2021-01-16 ENCOUNTER — Telehealth: Payer: Self-pay | Admitting: Internal Medicine

## 2021-01-16 NOTE — Telephone Encounter (Signed)
Pam with [facility] called and reports pt. Was connected with [facility] today.   Reports having mild chest pain, dizziness and sweating around 2am. Called EMS. Was evaluated bp 156/84 Heart rate-82 and oxygen-97%. Performed 12 lead EKG- Normal range. Pain resolved before EMS came. No chest pain today. HX of hypertension. Father passed away from Heart Attack.    Advised to go to ED or see PCP. Pt. Did not want to go to ED.

## 2021-01-19 NOTE — Progress Notes (Signed)
Subjective:    Patient ID: Jeremiah Mitchell, male    DOB: [birth year], 65 y.o.   MRN: [id number]  This visit occurred during the SARS-CoV-2 public health emergency.  Safety protocols were in place, including screening questions prior to the visit, additional usage of staff PPE, and extensive cleaning of exam room while observing appropriate contact time as indicated for disinfecting solutions.    HPI The patient is here for an acute visit for chest pain  Per triage notes: He had an episode of chest pain 01/16/21.  It occurred at 2 am - he had mild chest pain, dizziness and sweating.  EMS came and his pain resolved prior to their arrival.  His BP was 156/84, HR 82, O2 97%.  EKG was normal.  No chest pain later that day.  His father died from a heart attack.  He was advised to go to ED, but declined.   Had stress test - 11/2014 that was neg for ischemia  He is here today with his wife.  He has not had any chest pain since 12/5.  His wife helps with some of the history.  Apparently he was sleeping on the bed which has a heating pad and he had a heater fan on as well.  His wife thinks he probably got overheated.  At 2:00 in the morning he woke up complaining of chest pain.  He was lightheaded, sweaty and off balance.  His wife states he was staggering.  She also states he was a little out of it.  He laid down on the floor and asked his wife to call 911.  The chest pain went away and probably lasted about 4 minutes in total.  He is a very physical job and denies any chest pain, shortness of breath or other concerning symptoms with it.  Him and his wife both feel most likely he was overheated and that was the reason for the chest pain.    Medications and allergies reviewed with patient and updated if appropriate.  Patient Active Problem List   Diagnosis Date Noted   Primary hypertension 01/03/2021   Benign prostatic hyperplasia without lower urinary tract symptoms 01/03/2021   Hyperlipidemia  LDL goal <130 01/03/2021   Encounter for general adult medical examination with abnormal findings 01/03/2021   Vitamin B12 deficiency anemia due to intrinsic factor deficiency 01/03/2021   Dietary folate deficiency 01/03/2021   Screen for colon cancer 01/03/2021   Need for prophylactic vaccination with combined diphtheria-tetanus-pertussis (DTP) vaccine 01/03/2021   Need for immunization against influenza 01/03/2021   Need for vaccination 01/03/2021   Cholelithiases 07/21/2011    Current Outpatient Medications on File Prior to Visit  Medication Sig Dispense Refill   folic acid (FOLVITE) 1 MG tablet Take 1 tablet (1 mg total) by mouth daily. 90 tablet 1   indapamide (LOZOL) 1.25 MG tablet Take 1 tablet (1.25 mg total) by mouth daily. 90 tablet 0   olmesartan (BENICAR) 20 MG tablet Take 1 tablet (20 mg total) by mouth daily. 90 tablet 0   rosuvastatin (CRESTOR) 20 MG tablet Take 1 tablet (20 mg total) by mouth daily. 90 tablet 1   No current facility-administered medications on file prior to visit.    Past Medical History:  Diagnosis Date   Allergy    Depression    Hypertension     Past Surgical History:  Procedure Laterality Date   APPENDECTOMY      Social History   Socioeconomic History  Marital status: Married    Spouse name: Not on file   Number of children: Not on file   Years of education: Not on file   Highest education level: Not on file  Occupational History   Not on file  Tobacco Use   Smoking status: Never   Smokeless tobacco: Never  Substance and Sexual Activity   Alcohol use: No   Drug use: No   Sexual activity: Yes  Other Topics Concern   Not on file  Social History Narrative   Not on file   Social Determinants of Health   Financial Resource Strain: Not on file  Food Insecurity: Not on file  Transportation Needs: Not on file  Physical Activity: Not on file  Stress: Not on file  Social Connections: Not on file    Family History  Problem  Relation Age of Onset   Parkinson's disease Mother    Colon cancer Mother    Breast cancer Mother    Heart attack Father    Colon cancer Maternal Aunt    Colon cancer Maternal Uncle     Review of Systems  Constitutional:  Negative for chills and fever.  HENT:  Negative for congestion, sinus pain and sore throat.   Respiratory:  Negative for cough, shortness of breath and wheezing.   Cardiovascular:  Negative for chest pain, palpitations and leg swelling.  Gastrointestinal:  Negative for nausea.       No gerd  Neurological:  Negative for dizziness, light-headedness and headaches.      Objective:   Vitals:   01/20/21 0904  BP: 140/90  Pulse: 67  Temp: 98.7 F (37.1 C)  SpO2: 97%   BP Readings from Last 3 Encounters:  01/20/21 140/90  01/03/21 (!) 170/88  12/12/16 134/72   Wt Readings from Last 3 Encounters:  01/20/21 172 lb (78 kg)  01/03/21 172 lb (78 kg)  12/12/16 166 lb 6.4 oz (75.5 kg)   Body mass index is 25.4 kg/m.   Physical Exam    Constitutional: Appears well-developed and well-nourished. No distress.  Head: Normocephalic and atraumatic.  Neck: Neck supple. No tracheal deviation present. No thyromegaly present.  No cervical lymphadenopathy Cardiovascular: Normal rate, regular rhythm and normal heart sounds.  No murmur heard. No carotid bruit .  No edema Pulmonary/Chest: No chest discomfort with palpation.  Effort normal and breath sounds normal. No respiratory distress. No has no wheezes. No rales.  Skin: Skin is warm and dry. Not diaphoretic.  Psychiatric: Normal mood and affect. Behavior is normal.    EKG reviewed from EMS and will be scanned in the chart-normal sinus rhythm at 62 bpm, normal EKG-no change compared to EKG done on 01/03/2021   Assessment & Plan:    Chest pain: New 1 episode 12/5 in the middle of the night.  Chest pain lasted for minutes approximately and him and his wife feel he was overheated EKG by EMS was reassuring No  recurrence of the chest pain even with his physical job Discussed possibly seeing a cardiologist-he declined at this time Discussed CT coronary calcium score to help evaluate his risk-this is something he might be interested in and he can discuss with Dr. Yetta Barre at his next visit Stressed risk reduction-good blood pressure control, cholesterol control and healthy lifestyle If he does have any additional chest pain he will call us before his appointment  Hypertension: Recently placed on medication BP improved Encourage monitoring blood pressure at home or at work Has follow-up appointment with  Dr. Yetta Barre in a couple of months Continue indapamide 12.5 mg daily and Benicar 20 mg daily  B12 deficiency: Chronic He has not started taking B12 supplementation Wife wonders if he can have B12 injections for short period of time and she will get him started on B12 supplementation-I think that is reasonable B12 injection today and monthly until he sees Dr. Yetta Barre and then he can reevaluate Advised B supplementation

## 2021-01-20 ENCOUNTER — Ambulatory Visit (INDEPENDENT_AMBULATORY_CARE_PROVIDER_SITE_OTHER): Payer: Medicare Other | Admitting: Internal Medicine

## 2021-01-20 ENCOUNTER — Other Ambulatory Visit: Payer: Self-pay

## 2021-01-20 ENCOUNTER — Encounter: Payer: Self-pay | Admitting: Internal Medicine

## 2021-01-20 VITALS — BP 140/90 | HR 67 | Temp 98.7°F | Ht 69.0 in | Wt 172.0 lb

## 2021-01-20 DIAGNOSIS — D510 Vitamin B12 deficiency anemia due to intrinsic factor deficiency: Secondary | ICD-10-CM | POA: Diagnosis not present

## 2021-01-20 DIAGNOSIS — R079 Chest pain, unspecified: Secondary | ICD-10-CM

## 2021-01-20 MED ORDER — CYANOCOBALAMIN 1000 MCG/ML IJ SOLN
1000.0000 ug | Freq: Once | INTRAMUSCULAR | Status: AC
  Administered 2021-01-20: 1000 ug via INTRAMUSCULAR

## 2021-01-20 NOTE — Patient Instructions (Addendum)
    B12 injection today.     Start B supplementation.

## 2021-02-22 ENCOUNTER — Other Ambulatory Visit: Payer: Self-pay | Admitting: Internal Medicine

## 2021-02-22 DIAGNOSIS — I10 Essential (primary) hypertension: Secondary | ICD-10-CM

## 2021-02-22 MED ORDER — INDAPAMIDE 1.25 MG PO TABS: 1.2500 mg | ORAL_TABLET | Freq: Every day | ORAL | 1 refills | Status: DC

## 2021-03-27 ENCOUNTER — Other Ambulatory Visit: Payer: Self-pay | Admitting: Internal Medicine

## 2021-03-27 DIAGNOSIS — I10 Essential (primary) hypertension: Secondary | ICD-10-CM

## 2021-04-06 ENCOUNTER — Other Ambulatory Visit: Payer: Self-pay

## 2021-04-06 ENCOUNTER — Ambulatory Visit (INDEPENDENT_AMBULATORY_CARE_PROVIDER_SITE_OTHER): Payer: Medicare Other | Admitting: Internal Medicine

## 2021-04-06 ENCOUNTER — Encounter: Payer: Self-pay | Admitting: Internal Medicine

## 2021-04-06 VITALS — BP 136/84 | HR 56 | Temp 98.0°F | Resp 16 | Ht 69.0 in | Wt 171.0 lb

## 2021-04-06 DIAGNOSIS — I10 Essential (primary) hypertension: Secondary | ICD-10-CM

## 2021-04-06 DIAGNOSIS — Z1159 Encounter for screening for other viral diseases: Secondary | ICD-10-CM | POA: Diagnosis not present

## 2021-04-06 DIAGNOSIS — D510 Vitamin B12 deficiency anemia due to intrinsic factor deficiency: Secondary | ICD-10-CM

## 2021-04-06 DIAGNOSIS — Z114 Encounter for screening for human immunodeficiency virus [HIV]: Secondary | ICD-10-CM

## 2021-04-06 DIAGNOSIS — E538 Deficiency of other specified B group vitamins: Secondary | ICD-10-CM

## 2021-04-06 DIAGNOSIS — Z23 Encounter for immunization: Secondary | ICD-10-CM

## 2021-04-06 DIAGNOSIS — E785 Hyperlipidemia, unspecified: Secondary | ICD-10-CM

## 2021-04-06 LAB — BASIC METABOLIC PANEL
BUN: 14 mg/dL (ref 6–23)
CO2: 34 mEq/L — ABNORMAL HIGH (ref 19–32)
Calcium: 9.4 mg/dL (ref 8.4–10.5)
Chloride: 102 mEq/L (ref 96–112)
Creatinine, Ser: 1.2 mg/dL (ref 0.40–1.50)
GFR: 63.46 mL/min (ref >60.00)
Glucose, Bld: 103 mg/dL — ABNORMAL HIGH (ref 70–99)
Potassium: 4 mEq/L (ref 3.5–5.1)
Sodium: 140 mEq/L (ref 135–145)

## 2021-04-06 LAB — LIPID PANEL
Cholesterol: 151 mg/dL (ref 0–200)
HDL: 44.7 mg/dL (ref >39.00)
LDL Cholesterol: 89 mg/dL (ref 0–99)
NonHDL: 106.02
Total CHOL/HDL Ratio: 3
Triglycerides: 85 mg/dL (ref 0.0–149.0)
VLDL: 17 mg/dL (ref 0.0–40.0)

## 2021-04-06 MED ORDER — FOLIC ACID 1 MG PO TABS: 1.0000 mg | ORAL_TABLET | Freq: Every day | ORAL | 1 refills | Status: DC

## 2021-04-06 MED ORDER — CYANOCOBALAMIN 1000 MCG/ML IJ SOLN
1000.0000 ug | Freq: Once | INTRAMUSCULAR | Status: AC
  Administered 2021-04-06: 1000 ug via INTRAMUSCULAR

## 2021-04-06 MED ORDER — INDAPAMIDE 1.25 MG PO TABS: 1.2500 mg | ORAL_TABLET | Freq: Every day | ORAL | 1 refills | Status: DC

## 2021-04-06 MED ORDER — OLMESARTAN MEDOXOMIL 20 MG PO TABS: 20.0000 mg | ORAL_TABLET | Freq: Every day | ORAL | 1 refills | Status: DC

## 2021-04-06 MED ORDER — ROSUVASTATIN CALCIUM 20 MG PO TABS: 20.0000 mg | ORAL_TABLET | Freq: Every day | ORAL | 1 refills | Status: DC

## 2021-04-06 NOTE — Patient Instructions (Signed)

## 2021-04-06 NOTE — Progress Notes (Signed)
Subjective:  Patient ID: Jeremiah Mitchell, male    DOB: 05/18/1955  Age: 66 y.o. MRN: [id number]  CC: Hypertension and Hyperlipidemia  This visit occurred during the SARS-CoV-2 public health emergency.  Safety protocols were in place, including screening questions prior to the visit, additional usage of staff PPE, and extensive cleaning of exam room while observing appropriate contact time as indicated for disinfecting solutions.    HPI Jeremiah Mitchell presents for f/up -   He would like to undergo a CT calcium score.  He tells me his blood pressure is well controlled.  He is active and denies chest pain, shortness of breath, dyspnea on exertion, diaphoresis, dizziness, lightheadedness, near-syncope, or palpitations.  Outpatient Medications Prior to Visit  Medication Sig Dispense Refill   folic acid (FOLVITE) 1 MG tablet Take 1 tablet (1 mg total) by mouth daily. 90 tablet 1   indapamide (LOZOL) 1.25 MG tablet TAKE 1 TABLET BY MOUTH EVERY DAY 30 tablet 0   olmesartan (BENICAR) 20 MG tablet TAKE 1 TABLET BY MOUTH EVERY DAY 30 tablet 2   rosuvastatin (CRESTOR) 20 MG tablet Take 1 tablet (20 mg total) by mouth daily. 90 tablet 1   No facility-administered medications prior to visit.    ROS Review of Systems  All other systems reviewed and are negative.  Objective:  BP 136/84 (BP Location: Right Arm, Patient Position: Sitting, Cuff Size: Large)    Pulse (!) 56    Temp 98 F (36.7 C) (Oral)    Resp 16    Ht 5\' 9"  (1.753 m)    Wt 171 lb (77.6 kg)    SpO2 98%    BMI 25.25 kg/m   BP Readings from Last 3 Encounters:  04/06/21 136/84  01/20/21 140/90  01/03/21 (!) 170/88    Wt Readings from Last 3 Encounters:  04/06/21 171 lb (77.6 kg)  01/20/21 172 lb (78 kg)  01/03/21 172 lb (78 kg)    Physical Exam Vitals reviewed.  HENT:     Nose: Nose normal.     Mouth/Throat:     Pharynx: No oropharyngeal exudate.  Eyes:     General: No scleral icterus.    Conjunctiva/sclera:  Conjunctivae normal.  Cardiovascular:     Rate and Rhythm: Regular rhythm. Bradycardia present.     Heart sounds: No murmur heard. Pulmonary:     Effort: Pulmonary effort is normal.     Breath sounds: No stridor. No wheezing, rhonchi or rales.  Abdominal:     General: Abdomen is flat.     Palpations: There is no mass.     Tenderness: There is no abdominal tenderness. There is no guarding.     Hernia: No hernia is present.  Musculoskeletal:        General: Normal range of motion.     Cervical back: Neck supple.     Right lower leg: No edema.     Left lower leg: No edema.  Lymphadenopathy:     Cervical: No cervical adenopathy.  Skin:    General: Skin is warm and dry.  Neurological:     General: No focal deficit present.     Mental Status: He is alert.  Psychiatric:        Mood and Affect: Mood normal.        Behavior: Behavior normal.    Lab Results  Component Value Date   WBC 5.2 01/03/2021   HGB 15.1 01/03/2021   HCT 44.1 01/03/2021   PLT  276.0 01/03/2021   GLUCOSE 103 (H) 04/06/2021   CHOL 151 04/06/2021   TRIG 85.0 04/06/2021   HDL 44.70 04/06/2021   LDLCALC 89 04/06/2021   ALT 13 01/03/2021   AST 14 01/03/2021   NA 140 04/06/2021   K 4.0 04/06/2021   CL 102 04/06/2021   CREATININE 1.20 04/06/2021   BUN 14 04/06/2021   CO2 34 (H) 04/06/2021   TSH 4.29 01/03/2021   PSA 1.05 01/03/2021    No results found.  Assessment & Plan:   Jeremiah Mitchell was seen today for hypertension and hyperlipidemia.  Diagnoses and all orders for this visit:  Screening for HIV (human immunodeficiency virus) -     HIV Antibody (routine testing w rflx); Future -     HIV Antibody (routine testing w rflx)  Primary hypertension- His blood pressure is adequately well controlled.  Electrolytes and renal function are normal. -     indapamide (LOZOL) 1.25 MG tablet; Take 1 tablet (1.25 mg total) by mouth daily. -     olmesartan (BENICAR) 20 MG tablet; Take 1 tablet (20 mg total) by mouth  daily. -     Basic metabolic panel; Future -     CT CARDIAC SCORING (SELF PAY ONLY); Future -     Basic metabolic panel  Hyperlipidemia LDL goal <130- LDL goal achieved. Doing well on the statin  -     rosuvastatin (CRESTOR) 20 MG tablet; Take 1 tablet (20 mg total) by mouth daily. -     Lipid panel; Future -     CT CARDIAC SCORING (SELF PAY ONLY); Future -     Lipid panel  Dietary folate deficiency -     folic acid (FOLVITE) 1 MG tablet; Take 1 tablet (1 mg total) by mouth daily.  Need for hepatitis C screening test -     Hepatitis C antibody; Future -     Hepatitis C antibody  Vitamin B12 deficiency anemia due to intrinsic factor deficiency -     cyanocobalamin ((VITAMIN B-12)) injection 1,000 mcg  Other orders -     Varicella-zoster vaccine IM (Shingrix)   I have changed Jeremiah Mitchell's indapamide and olmesartan. I am also having him maintain his folic acid and rosuvastatin. We administered cyanocobalamin.  Meds ordered this encounter  Medications   folic acid (FOLVITE) 1 MG tablet    Sig: Take 1 tablet (1 mg total) by mouth daily.    Dispense:  90 tablet    Refill:  1   indapamide (LOZOL) 1.25 MG tablet    Sig: Take 1 tablet (1.25 mg total) by mouth daily.    Dispense:  90 tablet    Refill:  1   olmesartan (BENICAR) 20 MG tablet    Sig: Take 1 tablet (20 mg total) by mouth daily.    Dispense:  90 tablet    Refill:  1   rosuvastatin (CRESTOR) 20 MG tablet    Sig: Take 1 tablet (20 mg total) by mouth daily.    Dispense:  90 tablet    Refill:  1   cyanocobalamin ((VITAMIN B-12)) injection 1,000 mcg     Follow-up: Return in about 4 months (around 08/04/2021).  Sanda Linger, MD

## 2021-04-07 LAB — HEPATITIS C ANTIBODY
Hepatitis C Ab: NONREACTIVE
SIGNAL TO CUT-OFF: 0.02 (ref <1.00)

## 2021-04-07 LAB — HIV ANTIBODY (ROUTINE TESTING W REFLEX): HIV 1&2 Ab, 4th Generation: NONREACTIVE

## 2021-06-02 ENCOUNTER — Ambulatory Visit
Admission: RE | Admit: 2021-06-02 | Discharge: 2021-06-02 | Disposition: A | Discharge status: Home / Self Care | Payer: Self-pay | Source: Ambulatory Visit | Attending: Internal Medicine | Admitting: Internal Medicine

## 2021-06-02 DIAGNOSIS — E785 Hyperlipidemia, unspecified: Secondary | ICD-10-CM

## 2021-06-02 DIAGNOSIS — I10 Essential (primary) hypertension: Secondary | ICD-10-CM

## 2021-08-09 ENCOUNTER — Ambulatory Visit (INDEPENDENT_AMBULATORY_CARE_PROVIDER_SITE_OTHER): Payer: Medicare Other | Admitting: Internal Medicine

## 2021-08-09 ENCOUNTER — Encounter: Payer: Self-pay | Admitting: Internal Medicine

## 2021-08-09 VITALS — BP 140/78 | HR 56 | Temp 98.1°F | Ht 69.0 in | Wt 167.0 lb

## 2021-08-09 DIAGNOSIS — Z1211 Encounter for screening for malignant neoplasm of colon: Secondary | ICD-10-CM

## 2021-08-09 DIAGNOSIS — E785 Hyperlipidemia, unspecified: Secondary | ICD-10-CM

## 2021-08-09 DIAGNOSIS — D510 Vitamin B12 deficiency anemia due to intrinsic factor deficiency: Secondary | ICD-10-CM

## 2021-08-09 DIAGNOSIS — E538 Deficiency of other specified B group vitamins: Secondary | ICD-10-CM

## 2021-08-09 DIAGNOSIS — Z23 Encounter for immunization: Secondary | ICD-10-CM

## 2021-08-09 DIAGNOSIS — I10 Essential (primary) hypertension: Secondary | ICD-10-CM

## 2021-08-09 LAB — BASIC METABOLIC PANEL
BUN: 16 mg/dL (ref 6–23)
CO2: 28 mEq/L (ref 19–32)
Calcium: 9.6 mg/dL (ref 8.4–10.5)
Chloride: 102 mEq/L (ref 96–112)
Creatinine, Ser: 1.25 mg/dL (ref 0.40–1.50)
GFR: 60.28 mL/min (ref >60.00)
Glucose, Bld: 100 mg/dL — ABNORMAL HIGH (ref 70–99)
Potassium: 3.6 mEq/L (ref 3.5–5.1)
Sodium: 138 mEq/L (ref 135–145)

## 2021-08-09 LAB — CBC WITH DIFFERENTIAL/PLATELET
Basophils Absolute: 0 10*3/uL (ref 0.0–0.1)
Basophils Relative: 0.7 % (ref 0.0–3.0)
Eosinophils Absolute: 0.1 10*3/uL (ref 0.0–0.7)
Eosinophils Relative: 0.8 % (ref 0.0–5.0)
HCT: 44.2 % (ref 39.0–52.0)
Hemoglobin: 15 g/dL (ref 13.0–17.0)
Lymphocytes Relative: 27.3 % (ref 12.0–46.0)
Lymphs Abs: 1.8 10*3/uL (ref 0.7–4.0)
MCHC: 34 g/dL (ref 30.0–36.0)
MCV: 91.9 fl (ref 78.0–100.0)
Monocytes Absolute: 0.6 10*3/uL (ref 0.1–1.0)
Monocytes Relative: 8.5 % (ref 3.0–12.0)
Neutro Abs: 4.2 10*3/uL (ref 1.4–7.7)
Neutrophils Relative %: 62.7 % (ref 43.0–77.0)
Platelets: 272 10*3/uL (ref 150.0–400.0)
RBC: 4.81 Mil/uL (ref 4.22–5.81)
RDW: 13.7 % (ref 11.5–15.5)
WBC: 6.7 10*3/uL (ref 4.0–10.5)

## 2021-08-09 LAB — TSH: TSH: 5.36 u[IU]/mL (ref 0.35–5.50)

## 2021-08-09 MED ORDER — SHINGRIX 50 MCG/0.5ML IM SUSR: 0.5000 mL | Freq: Once | INTRAMUSCULAR | 0 refills | Status: AC

## 2021-08-09 MED ORDER — CYANOCOBALAMIN 1000 MCG/ML IJ SOLN
1000.0000 ug | Freq: Once | INTRAMUSCULAR | Status: AC
  Administered 2021-08-09: 1000 ug via INTRAMUSCULAR

## 2021-08-09 NOTE — Patient Instructions (Signed)
Hypertension, Adult High blood pressure (hypertension) is when the force of blood pumping through the arteries is too strong. The arteries are the blood vessels that carry blood from the heart throughout the body. Hypertension forces the heart to work harder to pump blood and may cause arteries to become narrow or stiff. Untreated or uncontrolled hypertension can lead to a heart attack, heart failure, a stroke, kidney disease, and other problems. A blood pressure reading consists of a higher number over a lower number. Ideally, your blood pressure should be below 120/80. The first ("top") number is called the systolic pressure. It is a measure of the pressure in your arteries as your heart beats. The second ("bottom") number is called the diastolic pressure. It is a measure of the pressure in your arteries as the heart relaxes. What are the causes? The exact cause of this condition is not known. There are some conditions that result in high blood pressure. What increases the risk? Certain factors may make you more likely to develop high blood pressure. Some of these risk factors are under your control, including: Smoking. Not getting enough exercise or physical activity. Being overweight. Having too much fat, sugar, calories, or salt (sodium) in your diet. Drinking too much alcohol. Other risk factors include: Having a personal history of heart disease, diabetes, high cholesterol, or kidney disease. Stress. Having a family history of high blood pressure and high cholesterol. Having obstructive sleep apnea. Age. The risk increases with age. What are the signs or symptoms? High blood pressure may not cause symptoms. Very high blood pressure (hypertensive crisis) may cause: Headache. Fast or irregular heartbeats (palpitations). Shortness of breath. Nosebleed. Nausea and vomiting. Vision changes. Severe chest pain, dizziness, and seizures. How is this diagnosed? This condition is diagnosed by  measuring your blood pressure while you are seated, with your arm resting on a flat surface, your legs uncrossed, and your feet flat on the floor. The cuff of the blood pressure monitor will be placed directly against the skin of your upper arm at the level of your heart. Blood pressure should be measured at least twice using the same arm. Certain conditions can cause a difference in blood pressure between your right and left arms. If you have a high blood pressure reading during one visit or you have normal blood pressure with other risk factors, you may be asked to: Return on a different day to have your blood pressure checked again. Monitor your blood pressure at home for 1 week or longer. If you are diagnosed with hypertension, you may have other blood or imaging tests to help your health care provider understand your overall risk for other conditions. How is this treated? This condition is treated by making healthy lifestyle changes, such as eating healthy foods, exercising more, and reducing your alcohol intake. You may be referred for counseling on a healthy diet and physical activity. Your health care provider may prescribe medicine if lifestyle changes are not enough to get your blood pressure under control and if: Your systolic blood pressure is above 130. Your diastolic blood pressure is above 80. Your personal target blood pressure may vary depending on your medical conditions, your age, and other factors. Follow these instructions at home: Eating and drinking  Eat a diet that is high in fiber and potassium, and low in sodium, added sugar, and fat. An example of this eating plan is called the DASH diet. DASH stands for Dietary Approaches to Stop Hypertension. To eat this way: Eat   plenty of fresh fruits and vegetables. Try to fill one half of your plate at each meal with fruits and vegetables. Eat whole grains, such as whole-wheat pasta, brown rice, or whole-grain bread. Fill about one  fourth of your plate with whole grains. Eat or drink low-fat dairy products, such as skim milk or low-fat yogurt. Avoid fatty cuts of meat, processed or cured meats, and poultry with skin. Fill about one fourth of your plate with lean proteins, such as fish, chicken without skin, beans, eggs, or tofu. Avoid pre-made and processed foods. These tend to be higher in sodium, added sugar, and fat. Reduce your daily sodium intake. Many people with hypertension should eat less than 1,500 mg of sodium a day. Do not drink alcohol if: Your health care provider tells you not to drink. You are pregnant, may be pregnant, or are planning to become pregnant. If you drink alcohol: Limit how much you have to: 0-1 drink a day for women. 0-2 drinks a day for men. Know how much alcohol is in your drink. In the [place] one drink equals one 12 oz bottle of beer (355 mL), one 5 oz glass of wine (148 mL), or one 1 oz glass of hard liquor (44 mL). Lifestyle  Work with your health care provider to maintain a healthy body weight or to lose weight. Ask what an ideal weight is for you. Get at least 30 minutes of exercise that causes your heart to beat faster (aerobic exercise) most days of the week. Activities may include walking, swimming, or biking. Include exercise to strengthen your muscles (resistance exercise), such as Pilates or lifting weights, as part of your weekly exercise routine. Try to do these types of exercises for 30 minutes at least 3 days a week. Do not use any products that contain nicotine or tobacco. These products include cigarettes, chewing tobacco, and vaping devices, such as e-cigarettes. If you need help quitting, ask your health care provider. Monitor your blood pressure at home as told by your health care provider. Keep all follow-up visits. This is important. Medicines Take over-the-counter and prescription medicines only as told by your health care provider. Follow directions carefully. Blood  pressure medicines must be taken as prescribed. Do not skip doses of blood pressure medicine. Doing this puts you at risk for problems and can make the medicine less effective. Ask your health care provider about side effects or reactions to medicines that you should watch for. Contact a health care provider if you: Think you are having a reaction to a medicine you are taking. Have headaches that keep coming back (recurring). Feel dizzy. Have swelling in your ankles. Have trouble with your vision. Get help right away if you: Develop a severe headache or confusion. Have unusual weakness or numbness. Feel faint. Have severe pain in your chest or abdomen. Vomit repeatedly. Have trouble breathing. These symptoms may be an emergency. Get help right away. Call 911. Do not wait to see if the symptoms will go away. Do not drive yourself to the hospital. Summary Hypertension is when the force of blood pumping through your arteries is too strong. If this condition is not controlled, it may put you at risk for serious complications. Your personal target blood pressure may vary depending on your medical conditions, your age, and other factors. For most people, a normal blood pressure is less than 120/80. Hypertension is treated with lifestyle changes, medicines, or a combination of both. Lifestyle changes include losing weight, eating a healthy,   low-sodium diet, exercising more, and limiting alcohol. This information is not intended to replace advice given to you by your health care provider. Make sure you discuss any questions you have with your health care provider. Document Revised: 12/06/2020 Document Reviewed: 12/06/2020 Elsevier Patient Education  2023 [employer]  

## 2021-08-09 NOTE — Progress Notes (Signed)
Subjective:  Patient ID: Jeremiah Mitchell, male    DOB: 05/20/1955  Age: 66 y.o. MRN: [id number]  CC: Hypertension   HPI Jeremiah Mitchell presents for f/up -  He is active and denies chest pain, shortness of breath, diaphoresis, dizziness, lightheadedness, or edema.  Outpatient Medications Prior to Visit  Medication Sig Dispense Refill   folic acid (FOLVITE) 1 MG tablet Take 1 tablet (1 mg total) by mouth daily. 90 tablet 1   indapamide (LOZOL) 1.25 MG tablet Take 1 tablet (1.25 mg total) by mouth daily. 90 tablet 1   olmesartan (BENICAR) 20 MG tablet Take 1 tablet (20 mg total) by mouth daily. 90 tablet 1   rosuvastatin (CRESTOR) 20 MG tablet Take 1 tablet (20 mg total) by mouth daily. 90 tablet 1   No facility-administered medications prior to visit.    ROS Review of Systems  Constitutional:  Negative for chills, diaphoresis, fatigue and fever.  HENT: Negative.    Eyes: Negative.   Respiratory:  Negative for cough, chest tightness, shortness of breath and wheezing.   Cardiovascular:  Negative for chest pain, palpitations and leg swelling.  Gastrointestinal:  Negative for abdominal pain, constipation, diarrhea, nausea and vomiting.  Endocrine: Negative.   Genitourinary: Negative.  Negative for difficulty urinating.  Musculoskeletal: Negative.   Skin: Negative.  Negative for color change.  Neurological:  Negative for dizziness, weakness and light-headedness.  Hematological:  Negative for adenopathy. Does not bruise/bleed easily.  Psychiatric/Behavioral: Negative.      Objective:  BP 140/78 (BP Location: Right Arm, Patient Position: Sitting, Cuff Size: Large)   Pulse (!) 56   Temp 98.1 F (36.7 C) (Oral)   Ht 5\' 9"  (1.753 m)   Wt 167 lb (75.8 kg)   SpO2 98%   BMI 24.66 kg/m   BP Readings from Last 3 Encounters:  08/09/21 140/78  04/06/21 136/84  01/20/21 140/90    Wt Readings from Last 3 Encounters:  08/09/21 167 lb (75.8 kg)  04/06/21 171 lb (77.6 kg)   01/20/21 172 lb (78 kg)    Physical Exam Vitals reviewed.  HENT:     Nose: Nose normal.     Mouth/Throat:     Mouth: Mucous membranes are moist.  Eyes:     General: No scleral icterus.    Conjunctiva/sclera: Conjunctivae normal.  Cardiovascular:     Rate and Rhythm: Normal rate and regular rhythm.     Heart sounds: No murmur heard. Pulmonary:     Effort: Pulmonary effort is normal.     Breath sounds: No stridor. No wheezing, rhonchi or rales.  Abdominal:     General: Abdomen is flat.     Palpations: There is no mass.     Tenderness: There is no abdominal tenderness. There is no guarding.     Hernia: No hernia is present.  Musculoskeletal:        General: Normal range of motion.     Cervical back: Neck supple.  Lymphadenopathy:     Cervical: No cervical adenopathy.  Skin:    General: Skin is warm and dry.  Neurological:     General: No focal deficit present.  Psychiatric:        Mood and Affect: Mood normal.        Behavior: Behavior normal.     Lab Results  Component Value Date   WBC 6.7 08/09/2021   HGB 15.0 08/09/2021   HCT 44.2 08/09/2021   PLT 272.0 08/09/2021   GLUCOSE 100 (  H) 08/09/2021   CHOL 151 04/06/2021   TRIG 85.0 04/06/2021   HDL 44.70 04/06/2021   LDLCALC 89 04/06/2021   ALT 13 01/03/2021   AST 14 01/03/2021   NA 138 08/09/2021   K 3.6 08/09/2021   CL 102 08/09/2021   CREATININE 1.25 08/09/2021   BUN 16 08/09/2021   CO2 28 08/09/2021   TSH 5.36 08/09/2021   PSA 1.05 01/03/2021    CT CARDIAC SCORING (SELF PAY ONLY)  Addendum Date: 06/02/2021   ADDENDUM REPORT: 06/02/2021 09:01 ADDENDUM: Cardiovascular Disease Risk stratification EXAM: Coronary Calcium Score TECHNIQUE: A gated, non-contrast computed tomography scan of the heart was performed using 35mm slice thickness. Axial images were analyzed on a dedicated workstation. Calcium scoring of the coronary arteries was performed using the Agatston method. FINDINGS: Coronary arteries: Normal  origins. Coronary Calcium Score: Left main: 0 Left anterior descending artery: 0 Left circumflex artery: 0 Right coronary artery: 0 Total: 0 Percentile: 0 Pericardium: Normal. Ascending Aorta: Normal caliber. Non-cardiac: See separate report from [facility]. IMPRESSION: Coronary calcium score of 0. This was 0 percentile for age-, race-, and sex-matched controls. RECOMMENDATIONS: Coronary artery calcium (CAC) score is a strong predictor of incident coronary heart disease (CHD) and provides predictive information beyond traditional risk factors. CAC scoring is reasonable to use in the decision to withhold, postpone, or initiate statin therapy in intermediate-risk or selected borderline-risk asymptomatic adults (age 31-75 years and LDL-C >=70 to <190 mg/dL) who do not have diabetes or established atherosclerotic cardiovascular disease (ASCVD).* In intermediate-risk (10-year ASCVD risk >=7.5% to <20%) adults or selected borderline-risk (10-year ASCVD risk >=5% to <7.5%) adults in whom a CAC score is measured for the purpose of making a treatment decision the following recommendations have been made: If CAC=0, it is reasonable to withhold statin therapy and reassess in 5 to 10 years, as long as higher risk conditions are absent (diabetes mellitus, family history of premature CHD in first degree relatives (males <55 years; females <65 years), cigarette smoking, or LDL >=190 mg/dL). If CAC is 1 to 99, it is reasonable to initiate statin therapy for patients >=69 years of age. If CAC is >=100 or >=75th percentile, it is reasonable to initiate statin therapy at any age. Cardiology referral should be considered for patients with CAC scores >=400 or >=75th percentile. *2018 AHA/ACC/AACVPR/AAPA/ABC/ACPM/ADA/AGS/APhA/ASPC/NLA/PCNA Guideline on the Management of Blood Cholesterol: A Report of the American College of Cardiology/American Heart Association Task Force on Clinical Practice Guidelines. J Am Coll Cardiol.  2019;73(24):3168-3209. Thomasene Ripple, DO The noncardiac portion of this study will be interpreted in separate report by the radiologist. Electronically Signed   By: Thomasene Ripple D.O.   On: 06/02/2021 09:01   Result Date: 06/02/2021 EXAM: OVER-READ INTERPRETATION  CT CHEST The following report is an over-read performed by radiologist Dr. Trudie Reed of [facility] on 06/02/2021. This over-read does not include interpretation of cardiac or coronary anatomy or pathology. The coronary calcium score interpretation by the cardiologist is attached. COMPARISON:  None. FINDINGS: Within the visualized portions of the thorax there are no suspicious appearing pulmonary nodules or masses, there is no acute consolidative airspace disease, no pleural effusions, no pneumothorax and no lymphadenopathy. Visualized portions of the upper abdomen are unremarkable. There are no aggressive appearing lytic or blastic lesions noted in the visualized portions of the skeleton. IMPRESSION: 1. No significant incidental noncardiac findings are noted. Electronically Signed: By: Trudie Reed M.D. On: 06/02/2021 07:48    Assessment & Plan:  Jeremiah Mitchell was seen today for hypertension.  Diagnoses and all orders for this visit:  Primary hypertension- His BP is adequately well controlled. -     Basic metabolic panel; Future -     TSH; Future -     TSH -     Basic metabolic panel  Vitamin B12 deficiency anemia due to intrinsic factor deficiency -     CBC with Differential/Platelet; Future -     cyanocobalamin ((VITAMIN B-12)) injection 1,000 mcg -     CBC with Differential/Platelet  Dietary folate deficiency -     CBC with Differential/Platelet; Future -     CBC with Differential/Platelet  Hyperlipidemia LDL goal <130- LDL goal achieved. Doing well on the statin  -     TSH; Future -     TSH  Screen for colon cancer -     Cancel: Cologuard  Need for prophylactic vaccination and inoculation against  varicella -     Zoster Vaccine Adjuvanted [facility]) injection; Inject 0.5 mLs into the muscle once for 1 dose.   I am having Jeremiah Mitchell. Jeremiah Mitchell start on Shingrix. I am also having him maintain his folic acid, indapamide, olmesartan, and rosuvastatin. We administered cyanocobalamin.  Meds ordered this encounter  Medications   cyanocobalamin ((VITAMIN B-12)) injection 1,000 mcg   Zoster Vaccine Adjuvanted [facility]) injection    Sig: Inject 0.5 mLs into the muscle once for 1 dose.    Dispense:  0.5 mL    Refill:  0     Follow-up: Return in about 6 months (around 02/08/2022).  Sanda Linger, MD

## 2021-09-13 ENCOUNTER — Ambulatory Visit (INDEPENDENT_AMBULATORY_CARE_PROVIDER_SITE_OTHER): Payer: Medicare Other

## 2021-09-13 DIAGNOSIS — Z0000 Encounter for general adult medical examination without abnormal findings: Secondary | ICD-10-CM | POA: Diagnosis not present

## 2021-09-13 NOTE — Progress Notes (Signed)
I connected with Jeremiah Mitchell today by telephone and verified that I am speaking with the correct person using two identifiers. Location patient: home Location provider: work Persons participating in the virtual visit: patient, provider.   I discussed the limitations, risks, security and privacy concerns of performing an evaluation and management service by telephone and the availability of in person appointments. I also discussed with the patient that there may be a patient responsible charge related to this service. The patient expressed understanding and verbally consented to this telephonic visit.    Interactive audio and video telecommunications were attempted between this provider and patient, however failed, due to patient having technical difficulties OR patient did not have access to video capability.  We continued and completed visit with audio only.  Some vital signs may be absent or patient reported.   Time Spent with patient on telephone encounter: 30 minutes  Subjective:   Jeremiah Mitchell is a 66 y.o. male who presents for an Initial Medicare Annual Wellness Visit.  Review of Systems     Cardiac Risk Factors include: advanced age (>55men, >68 women);family history of premature cardiovascular disease;hypertension;dyslipidemia;male gender     Objective:    There were no vitals filed for this visit. There is no height or weight on file to calculate BMI.     09/13/2021    9:00 AM 11/15/2016    1:47 PM 11/27/2015    9:34 AM 07/21/2011   10:30 AM  Advanced Directives  Does Patient Have a Medical Advance Directive? Yes No No Patient does not have advance directive  Type of Advance Directive Living will     Does patient want to make changes to medical advance directive? No - Patient declined     Would patient like information on creating a medical advance directive?   No - patient declined information     Current Medications (verified) Outpatient Encounter Medications  as of 09/13/2021  Medication Sig   folic acid (FOLVITE) 1 MG tablet Take 1 tablet (1 mg total) by mouth daily.   indapamide (LOZOL) 1.25 MG tablet Take 1 tablet (1.25 mg total) by mouth daily.   olmesartan (BENICAR) 20 MG tablet Take 1 tablet (20 mg total) by mouth daily.   rosuvastatin (CRESTOR) 20 MG tablet Take 1 tablet (20 mg total) by mouth daily.   No facility-administered encounter medications on file as of 09/13/2021.    Allergies (verified) Penicillins   History: Past Medical History:  Diagnosis Date   Allergy    Depression    Hypertension    Past Surgical History:  Procedure Laterality Date   APPENDECTOMY     Family History  Problem Relation Age of Onset   Parkinson's disease Mother    Colon cancer Mother    Breast cancer Mother    Heart attack Father    Colon cancer Maternal Aunt    Colon cancer Maternal Uncle    Social History   Socioeconomic History   Marital status: Married    Spouse name: Not on file   Number of children: Not on file   Years of education: Not on file   Highest education level: Not on file  Occupational History   Not on file  Tobacco Use   Smoking status: Never   Smokeless tobacco: Never  Substance and Sexual Activity   Alcohol use: No   Drug use: No   Sexual activity: Yes  Other Topics Concern   Not on file  Social History Narrative  Not on file   Social Determinants of Health   Financial Resource Strain: Low Risk  (09/13/2021)   Overall Financial Resource Strain (CARDIA)    Difficulty of Paying Living Expenses: Not hard at all  Food Insecurity: No Food Insecurity (09/13/2021)   Hunger Vital Sign    Worried About Running Out of Food in the Last Year: Never true    Ran Out of Food in the Last Year: Never true  Transportation Needs: No Transportation Needs (09/13/2021)   PRAPARE - Administrator, Civil Service (Medical): No    Lack of Transportation (Non-Medical): No  Physical Activity: Sufficiently Active (09/13/2021)    Exercise Vital Sign    Days of Exercise per Week: 5 days    Minutes of Exercise per Session: 60 min  Stress: No Stress Concern Present (09/13/2021)   [employer] of Occupational Health - Occupational Stress Questionnaire    Feeling of Stress : Not at all  Social Connections: Socially Integrated (09/13/2021)   Social Connection and Isolation Panel [NHANES]    Frequency of Communication with Friends and Family: More than three times a week    Frequency of Social Gatherings with Friends and Family: More than three times a week    Attends Religious Services: More than 4 times per year    Active Member of [employer] or Organizations: Yes    Attends Engineer, structural: More than 4 times per year    Marital Status: Married    Tobacco Counseling Counseling given: Not Answered   Clinical Intake:  Pre-visit preparation completed: Yes  Pain : No/denies pain     BMI - recorded: 24.66 (08/09/2021) Nutritional Status: BMI of 19-24  Normal Nutritional Risks: None Diabetes: No  How often do you need to have someone help you when you read instructions, pamphlets, or other written materials from your doctor or pharmacy?: 1 - Never What is the last grade level you completed in school?: HSG  Diabetic? no  Interpreter Needed?: No  Information entered by :: Susie Cassette, LPN.   Activities of Daily Living    09/13/2021    9:04 AM 01/03/2021    8:03 AM  In your present state of health, do you have any difficulty performing the following activities:  Hearing? 0 0  Vision? 0 0  Difficulty concentrating or making decisions? 0 0  Walking or climbing stairs? 0 0  Dressing or bathing? 0 0  Doing errands, shopping? 0 0  Preparing Food and eating ? N   Using the Toilet? N   In the past six months, have you accidently leaked urine? N   Do you have problems with loss of bowel control? N   Managing your Medications? N   Managing your Finances? N   Housekeeping or managing your  Housekeeping? N     Patient Care Team: Etta Grandchild, MD as PCP - General (Internal Medicine) [facility] as Therapist, music (Optometry)  Indicate any recent Medical Services you may have received from other than [facility] providers in the past year (date may be approximate).     Assessment:   This is a routine wellness examination for [place].  Hearing/Vision screen Hearing Screening - Comments:: Patient denied any hearing difficulty.   No hearing aids.  Vision Screening - Comments:: Patient does wear readers.  Eye exam done by: [facility]   Dietary issues and exercise activities discussed: Current Exercise Habits: The patient has a physically strenuous job, but  has no regular exercise apart from work., Type of exercise: walking, Time (Minutes): 60, Frequency (Times/Week): 5, Weekly Exercise (Minutes/Week): 300, Intensity: Intense, Exercise limited by: None identified   Goals Addressed             This Visit's Progress    My goal is to stay healthy and active.        Depression Screen    09/13/2021    8:59 AM 01/03/2021    8:06 AM 12/12/2016    8:50 AM  PHQ 2/9 Scores  PHQ - 2 Score 0 0 0    Fall Risk    09/13/2021    9:04 AM  Fall Risk   Falls in the past year? 0  Number falls in past yr: 0  Injury with Fall? 0  Risk for fall due to : No Fall Risks  Follow up Falls evaluation completed    FALL RISK PREVENTION PERTAINING TO THE HOME:  Any stairs in or around the home? No  If so, are there any without handrails? No  Home free of loose throw rugs in walkways, pet beds, electrical cords, etc? Yes  Adequate lighting in your home to reduce risk of falls? Yes   ASSISTIVE DEVICES UTILIZED TO PREVENT FALLS:  Life alert? No  Use of a cane, walker or w/c? No  Grab bars in the bathroom? No  Shower chair or bench in shower? Yes  Elevated toilet seat or a handicapped toilet? No   TIMED UP AND GO:  Was the test performed? No .   Length of time to ambulate 10 feet: n/a sec.   Appearance of gait: Gait not evaluated during this visit.  Cognitive Function:        09/13/2021    9:05 AM  6CIT Screen  What Year? 0 points  What month? 0 points  What time? 0 points  Count back from 20 0 points  Months in reverse 0 points  Repeat phrase 0 points  Total Score 0 points    Immunizations Immunization History  Administered Date(s) Administered   Fluad Quad(high Dose 65+) 01/03/2021   Moderna Sars-Covid-2 Vaccination 04/17/2019, 05/13/2019   PNEUMOCOCCAL CONJUGATE-20 01/03/2021   Zoster Recombinat (Shingrix) 04/06/2021    TDAP status: Up to date  Flu Vaccine status: Up to date  Pneumococcal vaccine status: Up to date  Covid-19 vaccine status: Completed vaccines  Qualifies for Shingles Vaccine? Yes   Zostavax completed No   Shingrix Completed?: No.    Education has been provided regarding the importance of this vaccine. Patient has been advised to call insurance company to determine out of pocket expense if they have not yet received this vaccine. Advised may also receive vaccine at local pharmacy or Health Dept. Verbalized acceptance and understanding.  Screening Tests Health Maintenance  Topic Date Due   COLONOSCOPY (Pts 45-85yrs Insurance coverage will need to be confirmed)  Never done   COVID-19 Vaccine (3 - Moderna series) 07/08/2019   Zoster Vaccines- Shingrix (2 of 2) 06/01/2021   INFLUENZA VACCINE  09/12/2021   TETANUS/TDAP  12/13/2023   Pneumonia Vaccine 51+ Years old  Completed   Hepatitis C Screening  Completed   HIV Screening  Completed   HPV VACCINES  Aged Out    Health Maintenance  Health Maintenance Due  Topic Date Due   COLONOSCOPY (Pts 45-78yrs Insurance coverage will need to be confirmed)  Never done   COVID-19 Vaccine (3 - Moderna series) 07/08/2019   Zoster Vaccines- Shingrix (2 of  2) 06/01/2021   INFLUENZA VACCINE  09/12/2021    Colorectal cancer screening: Referral to GI  placed 08/09/2021. Pt aware the office will call re: appt. (Cologuard)  Lung Cancer Screening: (Low Dose CT Chest recommended if Age 62-80 years, 30 pack-year currently smoking OR have quit w/in 15years.) does not qualify.   Lung Cancer Screening Referral: no  Additional Screening:  Hepatitis C Screening: does qualify; Completed 04/06/2021  Vision Screening: Recommended annual ophthalmology exams for early detection of glaucoma and other disorders of the eye. Is the patient up to date with their annual eye exam?  Yes  Who is the provider or what is the name of the office in which the patient attends annual eye exams? [facility] If pt is not established with a provider, would they like to be referred to a provider to establish care? No .   Dental Screening: Recommended annual dental exams for proper oral hygiene  Community Resource Referral / Chronic Care Management: CRR required this visit?  No   CCM required this visit?  No      Plan:     I have personally reviewed and noted the following in the patient's chart:   Medical and social history Use of alcohol, tobacco or illicit drugs  Current medications and supplements including opioid prescriptions. Patient is not currently taking opioid prescriptions. Functional ability and status Nutritional status Physical activity Advanced directives List of other physicians Hospitalizations, surgeries, and ER visits in previous 12 months Vitals Screenings to include cognitive, depression, and falls Referrals and appointments  In addition, I have reviewed and discussed with patient certain preventive protocols, quality metrics, and best practice recommendations. A written personalized care plan for preventive services as well as general preventive health recommendations were provided to patient.     Mickeal Needy, LPN   4/0/0867   Nurse Notes:  Patient is cogitatively intact. There were no vitals filed for  this visit. There is no height or weight on file to calculate BMI. Patient stated that he has no issues with gait or balance; does not use any assistive devices.

## 2021-09-13 NOTE — Patient Instructions (Signed)
Jeremiah Mitchell , Thank you for taking time to come for your Medicare Wellness Visit. I appreciate your ongoing commitment to your health goals. Please review the following plan we discussed and let me know if I can assist you in the future.   Screening recommendations/referrals: Cologuard Kit: Order placed on 08/09/2021 Recommended yearly ophthalmology/optometry visit for glaucoma screening and checkup Recommended yearly dental visit for hygiene and checkup  Vaccinations: Influenza vaccine: 01/03/2021 Pneumococcal vaccine: 01/03/2021 Tdap vaccine: 12/12/2013; due every 10 years Shingles vaccine: never done per patient  (no record in [facility]) Covid-19: 04/17/2019, 05/13/2019  Advanced directives: Living Will; in process of updating  Conditions/risks identified: Yes; Stay active and healthy.  Next appointment: Please schedule your next Medicare Wellness Visit with your Nurse Health Advisor in 1 year by calling [phone].  Preventive Care 66 Years and Older, Male Preventive care refers to lifestyle choices and visits with your health care provider that can promote health and wellness. What does preventive care include? A yearly physical exam. This is also called an annual well check. Dental exams once or twice a year. Routine eye exams. Ask your health care provider how often you should have your eyes checked. Personal lifestyle choices, including: Daily care of your teeth and gums. Regular physical activity. Eating a healthy diet. Avoiding tobacco and drug use. Limiting alcohol use. Practicing safe sex. Taking low doses of aspirin every day. Taking vitamin and mineral supplements as recommended by your health care provider. What happens during an annual well check? The services and screenings done by your health care provider during your annual well check will depend on your age, overall health, lifestyle risk factors, and family history of disease. Counseling  Your health care provider  may ask you questions about your: Alcohol use. Tobacco use. Drug use. Emotional well-being. Home and relationship well-being. Sexual activity. Eating habits. History of falls. Memory and ability to understand (cognition). Work and work Statistician. Screening  You may have the following tests or measurements: Height, weight, and BMI. Blood pressure. Lipid and cholesterol levels. These may be checked every 5 years, or more frequently if you are over 53 years old. Skin check. Lung cancer screening. You may have this screening every year starting at age 66 if you have a 30-pack-year history of smoking and currently smoke or have quit within the past 15 years. Fecal occult blood test (FOBT) of the stool. You may have this test every year starting at age 66. Flexible sigmoidoscopy or colonoscopy. You may have a sigmoidoscopy every 5 years or a colonoscopy every 10 years starting at age 66. Prostate cancer screening. Recommendations will vary depending on your family history and other risks. Hepatitis C blood test. Hepatitis B blood test. Sexually transmitted disease (STD) testing. Diabetes screening. This is done by checking your blood sugar (glucose) after you have not eaten for a while (fasting). You may have this done every 1-3 years. Abdominal aortic aneurysm (AAA) screening. You may need this if you are a current or former smoker. Osteoporosis. You may be screened starting at age 66 if you are at high risk. Talk with your health care provider about your test results, treatment options, and if necessary, the need for more tests. Vaccines  Your health care provider may recommend certain vaccines, such as: Influenza vaccine. This is recommended every year. Tetanus, diphtheria, and acellular pertussis (Tdap, Td) vaccine. You may need a Td booster every 10 years. Zoster vaccine. You may need this after age 15. Pneumococcal 13-valent conjugate (PCV13)  vaccine. One dose is recommended after  age 16. Pneumococcal polysaccharide (PPSV23) vaccine. One dose is recommended after age 6. Talk to your health care provider about which screenings and vaccines you need and how often you need them. This information is not intended to replace advice given to you by your health care provider. Make sure you discuss any questions you have with your health care provider. Document Released: 02/25/2015 Document Revised: 10/19/2015 Document Reviewed: 11/30/2014 Elsevier Interactive Patient Education  2017 [place] Prevention in the Home Falls can cause injuries. They can happen to people of all ages. There are many things you can do to make your home safe and to help prevent falls. What can I do on the outside of my home? Regularly fix the edges of walkways and driveways and fix any cracks. Remove anything that might make you trip as you walk through a door, such as a raised step or threshold. Trim any bushes or trees on the path to your home. Use bright outdoor lighting. Clear any walking paths of anything that might make someone trip, such as rocks or tools. Regularly check to see if handrails are loose or broken. Make sure that both sides of any steps have handrails. Any raised decks and porches should have guardrails on the edges. Have any leaves, snow, or ice cleared regularly. Use sand or salt on walking paths during winter. Clean up any spills in your garage right away. This includes oil or grease spills. What can I do in the bathroom? Use night lights. Install grab bars by the toilet and in the tub and shower. Do not use towel bars as grab bars. Use non-skid mats or decals in the tub or shower. If you need to sit down in the shower, use a plastic, non-slip stool. Keep the floor dry. Clean up any water that spills on the floor as soon as it happens. Remove soap buildup in the tub or shower regularly. Attach bath mats securely with double-sided non-slip rug tape. Do not have  throw rugs and other things on the floor that can make you trip. What can I do in the bedroom? Use night lights. Make sure that you have a light by your bed that is easy to reach. Do not use any sheets or blankets that are too big for your bed. They should not hang down onto the floor. Have a firm chair that has side arms. You can use this for support while you get dressed. Do not have throw rugs and other things on the floor that can make you trip. What can I do in the kitchen? Clean up any spills right away. Avoid walking on wet floors. Keep items that you use a lot in easy-to-reach places. If you need to reach something above you, use a strong step stool that has a grab bar. Keep electrical cords out of the way. Do not use floor polish or wax that makes floors slippery. If you must use wax, use non-skid floor wax. Do not have throw rugs and other things on the floor that can make you trip. What can I do with my stairs? Do not leave any items on the stairs. Make sure that there are handrails on both sides of the stairs and use them. Fix handrails that are broken or loose. Make sure that handrails are as long as the stairways. Check any carpeting to make sure that it is firmly attached to the stairs. Fix any carpet that is loose  or worn. Avoid having throw rugs at the top or bottom of the stairs. If you do have throw rugs, attach them to the floor with carpet tape. Make sure that you have a light switch at the top of the stairs and the bottom of the stairs. If you do not have them, ask someone to add them for you. What else can I do to help prevent falls? Wear shoes that: Do not have high heels. Have rubber bottoms. Are comfortable and fit you well. Are closed at the toe. Do not wear sandals. If you use a stepladder: Make sure that it is fully opened. Do not climb a closed stepladder. Make sure that both sides of the stepladder are locked into place. Ask someone to hold it for you, if  possible. Clearly mark and make sure that you can see: Any grab bars or handrails. First and last steps. Where the edge of each step is. Use tools that help you move around (mobility aids) if they are needed. These include: Canes. Walkers. Scooters. Crutches. Turn on the lights when you go into a dark area. Replace any light bulbs as soon as they burn out. Set up your furniture so you have a clear path. Avoid moving your furniture around. If any of your floors are uneven, fix them. If there are any pets around you, be aware of where they are. Review your medicines with your doctor. Some medicines can make you feel dizzy. This can increase your chance of falling. Ask your doctor what other things that you can do to help prevent falls. This information is not intended to replace advice given to you by your health care provider. Make sure you discuss any questions you have with your health care provider. Document Released: 11/25/2008 Document Revised: 07/07/2015 Document Reviewed: 03/05/2014 Elsevier Interactive Patient Education  2017 [employer].

## 2021-10-16 ENCOUNTER — Other Ambulatory Visit: Payer: Self-pay | Admitting: Internal Medicine

## 2021-10-16 DIAGNOSIS — I10 Essential (primary) hypertension: Secondary | ICD-10-CM

## 2021-12-20 ENCOUNTER — Other Ambulatory Visit: Payer: Self-pay | Admitting: Internal Medicine

## 2021-12-20 DIAGNOSIS — E785 Hyperlipidemia, unspecified: Secondary | ICD-10-CM

## 2022-02-08 ENCOUNTER — Ambulatory Visit: Payer: Medicare Other | Admitting: Internal Medicine

## 2022-02-19 DIAGNOSIS — F4323 Adjustment disorder with mixed anxiety and depressed mood: Secondary | ICD-10-CM | POA: Diagnosis not present

## 2022-03-26 ENCOUNTER — Encounter: Payer: Self-pay | Admitting: Internal Medicine

## 2022-03-26 ENCOUNTER — Ambulatory Visit (INDEPENDENT_AMBULATORY_CARE_PROVIDER_SITE_OTHER): Payer: Medicare Other | Admitting: Internal Medicine

## 2022-03-26 VITALS — BP 136/86 | HR 60 | Temp 98.0°F | Ht 69.0 in | Wt 166.0 lb

## 2022-03-26 DIAGNOSIS — B351 Tinea unguium: Secondary | ICD-10-CM

## 2022-03-26 MED ORDER — CICLOPIROX 8 % EX SOLN: Freq: Every day | CUTANEOUS | 5 refills | Status: DC

## 2022-03-26 NOTE — Progress Notes (Signed)
    Subjective:    Patient ID: Jeremiah Mitchell, male    DOB: 10-May-1955, 67 y.o.   MRN: [id number]      HPI Muzammil is here for  Chief Complaint  Patient presents with   Nail Problem    Toe fungus both feet     7 months has had thickening and yellowing of most of his toenails - thinks it is a fungal infection.  No pain.  Has been apply an otc toenail fungus ointment w/o much improvement.  Also uses a anti-fungal spray on his feet/toes.     Medications and allergies reviewed with patient and updated if appropriate.  Current Outpatient Medications on File Prior to Visit  Medication Sig Dispense Refill   folic acid (FOLVITE) 1 MG tablet Take 1 tablet (1 mg total) by mouth daily. 90 tablet 1   indapamide (LOZOL) 1.25 MG tablet TAKE 1 TABLET BY MOUTH DAILY. 90 tablet 1   olmesartan (BENICAR) 20 MG tablet Take 1 tablet (20 mg total) by mouth daily. 90 tablet 1   rosuvastatin (CRESTOR) 20 MG tablet TAKE 1 TABLET BY MOUTH EVERY DAY 90 tablet 0   No current facility-administered medications on file prior to visit.    Review of Systems     Objective:   Vitals:   03/26/22 0753  BP: 136/86  Pulse: 60  Temp: 98 F (36.7 C)  SpO2: 98%   BP Readings from Last 3 Encounters:  03/26/22 136/86  08/09/21 140/78  04/06/21 136/84   Wt Readings from Last 3 Encounters:  03/26/22 166 lb (75.3 kg)  08/09/21 167 lb (75.8 kg)  04/06/21 171 lb (77.6 kg)   Body mass index is 24.51 kg/m.    Physical Exam Constitutional:      General: He is not in acute distress.    Appearance: Normal appearance. He is not ill-appearing.  Skin:    General: Skin is warm and dry.     Comments: 8/10 toenails with yellowing, slight thickening of nails.  No dryness or redness on feet or between toes  Neurological:     Mental Status: He is alert.            Assessment & Plan:    Onychomycosis -  New Started about 7 months ago 8/10 nails affected - yellowish and thickened Otc medication  not that effective Will try ciclopirox topical - will need to use for at least 3 months Jublia does not look like it is covered - can try to get approved by insurance if above not effective Would like to avoid oral medication due to possible liver damage  Follow up as needed - can refer to derm if needed

## 2022-03-26 NOTE — Patient Instructions (Addendum)
        Medications changes include :   ciclopirox topical for toenails.    Return if symptoms worsen or fail to improve.

## 2022-04-24 ENCOUNTER — Telehealth: Payer: Self-pay | Admitting: Internal Medicine

## 2022-04-24 ENCOUNTER — Other Ambulatory Visit: Payer: Self-pay | Admitting: Internal Medicine

## 2022-04-24 DIAGNOSIS — I10 Essential (primary) hypertension: Secondary | ICD-10-CM

## 2022-04-24 MED ORDER — INDAPAMIDE 1.25 MG PO TABS: 1.2500 mg | ORAL_TABLET | Freq: Every day | ORAL | 0 refills | Status: DC

## 2022-04-24 NOTE — Telephone Encounter (Signed)
Prescription Request  04/24/2022  LOV: 08/09/2021  What is the name of the medication or equipment?  indapamide (LOZOL) 1.25 MG tablet  Have you contacted your pharmacy to request a refill? Yes   Which pharmacy would you like this sent to?  [id number] [place], [place] - [address] [place] [address]Phone: [phone]: [phone]  Patient notified that their request is being sent to the clinical staff for review and that they should receive a response within 2 business days.   Please advise at Mobile [phone](mobile)    Patient has office visit scheduled for 05/03/22

## 2022-04-30 DIAGNOSIS — F4323 Adjustment disorder with mixed anxiety and depressed mood: Secondary | ICD-10-CM | POA: Diagnosis not present

## 2022-05-03 ENCOUNTER — Ambulatory Visit: Payer: Medicare Other | Admitting: Internal Medicine

## 2022-05-16 ENCOUNTER — Other Ambulatory Visit: Payer: Self-pay | Admitting: Internal Medicine

## 2022-05-16 DIAGNOSIS — I10 Essential (primary) hypertension: Secondary | ICD-10-CM

## 2022-05-22 DIAGNOSIS — F4323 Adjustment disorder with mixed anxiety and depressed mood: Secondary | ICD-10-CM | POA: Diagnosis not present

## 2022-06-12 DIAGNOSIS — K080 Exfoliation of teeth due to systemic causes: Secondary | ICD-10-CM | POA: Diagnosis not present

## 2022-06-14 ENCOUNTER — Telehealth: Payer: Self-pay | Admitting: Internal Medicine

## 2022-06-14 NOTE — Telephone Encounter (Signed)
Refills was denied by provider. Must see md for refills./lmb

## 2022-06-14 NOTE — Telephone Encounter (Signed)
Prescription Request  06/14/2022  LOV: 08/09/2021 NOV: 5.6.24  What is the name of the medication or equipment?  rosuvastatin (CRESTOR) 20 MG tablet  indapamide (LOZOL) 1.25 MG tablet    Have you contacted your pharmacy to request a refill? Yes   Which pharmacy would you like this sent to?    [facility] [address] [place] [address] Phone: [phone] Fax: [phone]    Patient notified that their request is being sent to the clinical staff for review and that they should receive a response within 2 business days.   Please advise at [facility] [phone]   Pt has an appointment on Monday but is going to run out of medication over the weekend, pt's spouse is requesting an early refill

## 2022-06-18 ENCOUNTER — Encounter: Payer: Self-pay | Admitting: Internal Medicine

## 2022-06-18 ENCOUNTER — Ambulatory Visit (INDEPENDENT_AMBULATORY_CARE_PROVIDER_SITE_OTHER): Payer: Medicare Other | Admitting: Internal Medicine

## 2022-06-18 VITALS — BP 144/84 | HR 67 | Temp 98.2°F | Ht 69.0 in | Wt 162.0 lb

## 2022-06-18 DIAGNOSIS — L233 Allergic contact dermatitis due to drugs in contact with skin: Secondary | ICD-10-CM | POA: Diagnosis not present

## 2022-06-18 DIAGNOSIS — E785 Hyperlipidemia, unspecified: Secondary | ICD-10-CM

## 2022-06-18 DIAGNOSIS — I10 Essential (primary) hypertension: Secondary | ICD-10-CM | POA: Diagnosis not present

## 2022-06-18 DIAGNOSIS — D510 Vitamin B12 deficiency anemia due to intrinsic factor deficiency: Secondary | ICD-10-CM

## 2022-06-18 DIAGNOSIS — Z0000 Encounter for general adult medical examination without abnormal findings: Secondary | ICD-10-CM

## 2022-06-18 DIAGNOSIS — E538 Deficiency of other specified B group vitamins: Secondary | ICD-10-CM

## 2022-06-18 DIAGNOSIS — N400 Enlarged prostate without lower urinary tract symptoms: Secondary | ICD-10-CM

## 2022-06-18 DIAGNOSIS — Z23 Encounter for immunization: Secondary | ICD-10-CM | POA: Diagnosis not present

## 2022-06-18 DIAGNOSIS — Z0001 Encounter for general adult medical examination with abnormal findings: Secondary | ICD-10-CM

## 2022-06-18 DIAGNOSIS — N1831 Chronic kidney disease, stage 3a: Secondary | ICD-10-CM

## 2022-06-18 DIAGNOSIS — Z1211 Encounter for screening for malignant neoplasm of colon: Secondary | ICD-10-CM | POA: Insufficient documentation

## 2022-06-18 LAB — URINALYSIS, ROUTINE W REFLEX MICROSCOPIC
Bilirubin Urine: NEGATIVE
Hgb urine dipstick: NEGATIVE
Ketones, ur: NEGATIVE
Leukocytes,Ua: NEGATIVE
Nitrite: NEGATIVE
RBC / HPF: NONE SEEN (ref 0–?)
Specific Gravity, Urine: 1.015 (ref 1.000–1.030)
Total Protein, Urine: NEGATIVE
Urine Glucose: NEGATIVE
Urobilinogen, UA: 0.2 (ref 0.0–1.0)
WBC, UA: NONE SEEN (ref 0–?)
pH: 6 (ref 5.0–8.0)

## 2022-06-18 LAB — HEPATIC FUNCTION PANEL
ALT: 11 U/L (ref 0–53)
AST: 12 U/L (ref 0–37)
Albumin: 4.2 g/dL (ref 3.5–5.2)
Alkaline Phosphatase: 66 U/L (ref 39–117)
Bilirubin, Direct: 0.2 mg/dL (ref 0.0–0.3)
Total Bilirubin: 1 mg/dL (ref 0.2–1.2)
Total Protein: 7 g/dL (ref 6.0–8.3)

## 2022-06-18 LAB — LIPID PANEL
Cholesterol: 201 mg/dL — ABNORMAL HIGH (ref 0–200)
HDL: 39.5 mg/dL (ref >39.00)
LDL Cholesterol: 135 mg/dL — ABNORMAL HIGH (ref 0–99)
NonHDL: 161.66
Total CHOL/HDL Ratio: 5
Triglycerides: 134 mg/dL (ref 0.0–149.0)
VLDL: 26.8 mg/dL (ref 0.0–40.0)

## 2022-06-18 LAB — BASIC METABOLIC PANEL
BUN: 15 mg/dL (ref 6–23)
CO2: 28 mEq/L (ref 19–32)
Calcium: 9.4 mg/dL (ref 8.4–10.5)
Chloride: 102 mEq/L (ref 96–112)
Creatinine, Ser: 1.22 mg/dL (ref 0.40–1.50)
GFR: 61.69 mL/min (ref >60.00)
Glucose, Bld: 98 mg/dL (ref 70–99)
Potassium: 4.6 mEq/L (ref 3.5–5.1)
Sodium: 140 mEq/L (ref 135–145)

## 2022-06-18 LAB — CBC WITH DIFFERENTIAL/PLATELET
Basophils Absolute: 0 10*3/uL (ref 0.0–0.1)
Basophils Relative: 0.4 % (ref 0.0–3.0)
Eosinophils Absolute: 0.1 10*3/uL (ref 0.0–0.7)
Eosinophils Relative: 1.5 % (ref 0.0–5.0)
HCT: 44.1 % (ref 39.0–52.0)
Hemoglobin: 15.1 g/dL (ref 13.0–17.0)
Lymphocytes Relative: 30.4 % (ref 12.0–46.0)
Lymphs Abs: 1.6 10*3/uL (ref 0.7–4.0)
MCHC: 34.3 g/dL (ref 30.0–36.0)
MCV: 92.5 fl (ref 78.0–100.0)
Monocytes Absolute: 0.5 10*3/uL (ref 0.1–1.0)
Monocytes Relative: 10 % (ref 3.0–12.0)
Neutro Abs: 3.1 10*3/uL (ref 1.4–7.7)
Neutrophils Relative %: 57.7 % (ref 43.0–77.0)
Platelets: 297 10*3/uL (ref 150.0–400.0)
RBC: 4.76 Mil/uL (ref 4.22–5.81)
RDW: 14.2 % (ref 11.5–15.5)
WBC: 5.3 10*3/uL (ref 4.0–10.5)

## 2022-06-18 LAB — FOLATE: Folate: 9.4 ng/mL (ref >5.9)

## 2022-06-18 LAB — TSH: TSH: 4.91 u[IU]/mL (ref 0.35–5.50)

## 2022-06-18 LAB — PSA: PSA: 1.01 ng/mL (ref 0.10–4.00)

## 2022-06-18 LAB — VITAMIN B12: Vitamin B-12: 220 pg/mL (ref 211–911)

## 2022-06-18 MED ORDER — TRIAMCINOLONE ACETONIDE 0.5 % EX CREA
1.0000 | TOPICAL_CREAM | Freq: Two times a day (BID) | CUTANEOUS | 2 refills | Status: DC
Start: 2022-06-18 — End: 2022-09-05

## 2022-06-18 MED ORDER — ROSUVASTATIN CALCIUM 20 MG PO TABS: 20.0000 mg | ORAL_TABLET | Freq: Every day | ORAL | 1 refills | Status: DC

## 2022-06-18 MED ORDER — OLMESARTAN MEDOXOMIL 20 MG PO TABS: 20.0000 mg | ORAL_TABLET | Freq: Every day | ORAL | 1 refills | Status: DC

## 2022-06-18 MED ORDER — FOLIC ACID 1 MG PO TABS
1.0000 mg | ORAL_TABLET | Freq: Every day | ORAL | 1 refills | Status: DC
Start: 2022-06-18 — End: 2022-12-19

## 2022-06-18 MED ORDER — INDAPAMIDE 1.25 MG PO TABS: 1.2500 mg | ORAL_TABLET | Freq: Every day | ORAL | 1 refills | Status: DC

## 2022-06-18 NOTE — Progress Notes (Unsigned)
Subjective:  Patient ID: Jeremiah Mitchell, male    DOB: 09-09-1955  Age: 67 y.o. MRN: [id number]  CC: Annual Exam, Hypertension, Hyperlipidemia, and Rash   HPI Jeremiah Mitchell presents for a CPX and f/up ----  He is active and denies DOE, CP, edema, or palpitations. He is taking lozol but not benicar.  Outpatient Medications Prior to Visit  Medication Sig Dispense Refill   ciclopirox (PENLAC) 8 % solution Apply topically at bedtime. Apply over nail and surrounding skin. Apply daily over previous coat. After seven (7) days, may remove with alcohol and continue cycle. 6.6 mL 5   folic acid (FOLVITE) 1 MG tablet Take 1 tablet (1 mg total) by mouth daily. 90 tablet 1   indapamide (LOZOL) 1.25 MG tablet Take 1 tablet (1.25 mg total) by mouth daily. 90 tablet 0   olmesartan (BENICAR) 20 MG tablet Take 1 tablet (20 mg total) by mouth daily. 90 tablet 1   rosuvastatin (CRESTOR) 20 MG tablet TAKE 1 TABLET BY MOUTH EVERY DAY 90 tablet 0   No facility-administered medications prior to visit.    ROS Review of Systems  Constitutional: Negative.  Negative for diaphoresis and fatigue.  HENT: Negative.    Eyes: Negative.   Respiratory: Negative.  Negative for cough, chest tightness, shortness of breath and wheezing.   Cardiovascular:  Negative for chest pain, palpitations and leg swelling.  Gastrointestinal:  Negative for abdominal pain, constipation, diarrhea, nausea and vomiting.  Genitourinary: Negative.  Negative for difficulty urinating.  Musculoskeletal: Negative.  Negative for arthralgias and myalgias.  Skin:  Positive for rash.       Itchy red rash on his buttocks that has not resolved with an OTC cream  Neurological:  Negative for dizziness, weakness and headaches.  Hematological:  Negative for adenopathy. Does not bruise/bleed easily.  Psychiatric/Behavioral: Negative.      Objective:  BP (!) 144/84 (BP Location: Right Arm, Patient Position: Sitting, Cuff Size: Large)   Pulse  67   Temp 98.2 F (36.8 C) (Oral)   Ht 5\' 9"  (1.753 m)   Wt 162 lb (73.5 kg)   SpO2 99%   BMI 23.92 kg/m   BP Readings from Last 3 Encounters:  06/18/22 (!) 144/84  03/26/22 136/86  08/09/21 140/78    Wt Readings from Last 3 Encounters:  06/18/22 162 lb (73.5 kg)  03/26/22 166 lb (75.3 kg)  08/09/21 167 lb (75.8 kg)    Physical Exam Vitals reviewed.  Constitutional:      Appearance: Normal appearance.  HENT:     Nose: Nose normal.     Mouth/Throat:     Mouth: Mucous membranes are moist.  Eyes:     General: No scleral icterus.    Conjunctiva/sclera: Conjunctivae normal.  Cardiovascular:     Rate and Rhythm: Regular rhythm. Bradycardia present.     Heart sounds: No murmur heard.    No friction rub. No gallop.     Comments: EKG- SB with a PAC, 57 bpm  LAE No LVH or Q waves Pulmonary:     Effort: Pulmonary effort is normal.     Breath sounds: No stridor. No wheezing, rhonchi or rales.  Abdominal:     General: Abdomen is flat.     Palpations: There is no mass.     Tenderness: There is no abdominal tenderness. There is no guarding.     Hernia: No hernia is present. There is no hernia in the left inguinal area or right inguinal  area.  Genitourinary:    Pubic Area: Rash present.     Penis: Normal and circumcised.      Testes: Normal.     Epididymis:     Right: Normal.     Left: Normal.     Prostate: Enlarged. Not tender and no nodules present.     Rectum: Normal. Guaiac result negative. No mass, tenderness, anal fissure, external hemorrhoid or internal hemorrhoid. Normal anal tone.  Musculoskeletal:        General: Normal range of motion.     Cervical back: Neck supple.     Right lower leg: No edema.     Left lower leg: No edema.  Lymphadenopathy:     Cervical: No cervical adenopathy.     Lower Body: No right inguinal adenopathy. No left inguinal adenopathy.  Skin:    General: Skin is warm and dry.     Findings: No lesion.  Neurological:     General: No  focal deficit present.     Mental Status: He is alert. Mental status is at baseline.  Psychiatric:        Mood and Affect: Mood normal.        Behavior: Behavior normal.     Lab Results  Component Value Date   WBC 5.3 06/18/2022   HGB 15.1 06/18/2022   HCT 44.1 06/18/2022   PLT 297.0 06/18/2022   GLUCOSE 98 06/18/2022   CHOL 201 (H) 06/18/2022   TRIG 134.0 06/18/2022   HDL 39.50 06/18/2022   LDLCALC 135 (H) 06/18/2022   ALT 11 06/18/2022   AST 12 06/18/2022   NA 140 06/18/2022   K 4.6 06/18/2022   CL 102 06/18/2022   CREATININE 1.22 06/18/2022   BUN 15 06/18/2022   CO2 28 06/18/2022   TSH 4.91 06/18/2022   PSA 1.01 06/18/2022    CT CARDIAC SCORING (SELF PAY ONLY)  Addendum Date: 06/02/2021   ADDENDUM REPORT: 06/02/2021 09:01 ADDENDUM: Cardiovascular Disease Risk stratification EXAM: Coronary Calcium Score TECHNIQUE: A gated, non-contrast computed tomography scan of the heart was performed using 3mm slice thickness. Axial images were analyzed on a dedicated workstation. Calcium scoring of the coronary arteries was performed using the Agatston method. FINDINGS: Coronary arteries: Normal origins. Coronary Calcium Score: Left main: 0 Left anterior descending artery: 0 Left circumflex artery: 0 Right coronary artery: 0 Total: 0 Percentile: 0 Pericardium: Normal. Ascending Aorta: Normal caliber. Non-cardiac: See separate report from [facility]. IMPRESSION: Coronary calcium score of 0. This was 0 percentile for age-, race-, and sex-matched controls. RECOMMENDATIONS: Coronary artery calcium (CAC) score is a strong predictor of incident coronary heart disease (CHD) and provides predictive information beyond traditional risk factors. CAC scoring is reasonable to use in the decision to withhold, postpone, or initiate statin therapy in intermediate-risk or selected borderline-risk asymptomatic adults (age 25-75 years and LDL-C >=70 to <190 mg/dL) who do not have diabetes or established  atherosclerotic cardiovascular disease (ASCVD).* In intermediate-risk (10-year ASCVD risk >=7.5% to <20%) adults or selected borderline-risk (10-year ASCVD risk >=5% to <7.5%) adults in whom a CAC score is measured for the purpose of making a treatment decision the following recommendations have been made: If CAC=0, it is reasonable to withhold statin therapy and reassess in 5 to 10 years, as long as higher risk conditions are absent (diabetes mellitus, family history of premature CHD in first degree relatives (males <55 years; females <65 years), cigarette smoking, or LDL >=190 mg/dL). If CAC is 1 to 99, it is reasonable  to initiate statin therapy for patients >=40 years of age. If CAC is >=100 or >=75th percentile, it is reasonable to initiate statin therapy at any age. Cardiology referral should be considered for patients with CAC scores >=400 or >=75th percentile. *2018 AHA/ACC/AACVPR/AAPA/ABC/ACPM/ADA/AGS/APhA/ASPC/NLA/PCNA Guideline on the Management of Blood Cholesterol: A Report of the American College of Cardiology/American Heart Association Task Force on Clinical Practice Guidelines. J Am Coll Cardiol. 2019;73(24):3168-3209. Thomasene Ripple, DO The noncardiac portion of this study will be interpreted in separate report by the radiologist. Electronically Signed   By: Thomasene Ripple D.O.   On: 06/02/2021 09:01   Result Date: 06/02/2021 EXAM: OVER-READ INTERPRETATION  CT CHEST The following report is an over-read performed by radiologist Dr. Trudie Reed of [facility] on 06/02/2021. This over-read does not include interpretation of cardiac or coronary anatomy or pathology. The coronary calcium score interpretation by the cardiologist is attached. COMPARISON:  None. FINDINGS: Within the visualized portions of the thorax there are no suspicious appearing pulmonary nodules or masses, there is no acute consolidative airspace disease, no pleural effusions, no pneumothorax and no lymphadenopathy.  Visualized portions of the upper abdomen are unremarkable. There are no aggressive appearing lytic or blastic lesions noted in the visualized portions of the skeleton. IMPRESSION: 1. No significant incidental noncardiac findings are noted. Electronically Signed: By: Trudie Reed M.D. On: 06/02/2021 07:48    Assessment & Plan:  Vitamin B12 deficiency anemia due to intrinsic factor deficiency -     CBC with Differential/Platelet; Future -     Vitamin B12; Future -     Folate; Future  Primary hypertension -     Urinalysis, Routine w reflex microscopic; Future -     Basic metabolic panel; Future -     EKG 12-Lead -     Olmesartan Medoxomil; Take 1 tablet (20 mg total) by mouth daily.  Dispense: 90 tablet; Refill: 1  Hyperlipidemia LDL goal <130 -     Lipid panel; Future -     TSH; Future -     Hepatic function panel; Future -     Rosuvastatin Calcium; Take 1 tablet (20 mg total) by mouth daily.  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  Dispense: 60 g; Refill: 2  Benign prostatic hyperplasia without lower urinary tract symptoms -     PSA; Future  Stage 3a chronic kidney disease (HCC)  Other orders -     Tdap vaccine greater than or equal to 7yo IM     Follow-up: Return in about 6 months (around 12/19/2022).  Sanda Linger, MD

## 2022-06-18 NOTE — Patient Instructions (Signed)
Health Maintenance, Male Adopting a healthy lifestyle and getting preventive care are important in promoting health and wellness. Ask your health care provider about: The right schedule for you to have regular tests and exams. Things you can do on your own to prevent diseases and keep yourself healthy. What should I know about diet, weight, and exercise? Eat a healthy diet  Eat a diet that includes plenty of vegetables, fruits, low-fat dairy products, and lean protein. Do not eat a lot of foods that are high in solid fats, added sugars, or sodium. Maintain a healthy weight Body mass index (BMI) is a measurement that can be used to identify possible weight problems. It estimates body fat based on height and weight. Your health care provider can help determine your BMI and help you achieve or maintain a healthy weight. Get regular exercise Get regular exercise. This is one of the most important things you can do for your health. Most adults should: Exercise for at least 150 minutes each week. The exercise should increase your heart rate and make you sweat (moderate-intensity exercise). Do strengthening exercises at least twice a week. This is in addition to the moderate-intensity exercise. Spend less time sitting. Even light physical activity can be beneficial. Watch cholesterol and blood lipids Have your blood tested for lipids and cholesterol at 67 years of age, then have this test every 5 years. You may need to have your cholesterol levels checked more often if: Your lipid or cholesterol levels are high. You are older than 67 years of age. You are at high risk for heart disease. What should I know about cancer screening? Many types of cancers can be detected early and may often be prevented. Depending on your health history and family history, you may need to have cancer screening at various ages. This may include screening for: Colorectal cancer. Prostate cancer. Skin cancer. Lung  cancer. What should I know about heart disease, diabetes, and high blood pressure? Blood pressure and heart disease High blood pressure causes heart disease and increases the risk of stroke. This is more likely to develop in people who have high blood pressure readings or are overweight. Talk with your health care provider about your target blood pressure readings. Have your blood pressure checked: Every 3-5 years if you are 18-39 years of age. Every year if you are 40 years old or older. If you are between the ages of 65 and 75 and are a current or former smoker, ask your health care provider if you should have a one-time screening for abdominal aortic aneurysm (AAA). Diabetes Have regular diabetes screenings. This checks your fasting blood sugar level. Have the screening done: Once every three years after age 45 if you are at a normal weight and have a low risk for diabetes. More often and at a younger age if you are overweight or have a high risk for diabetes. What should I know about preventing infection? Hepatitis B If you have a higher risk for hepatitis B, you should be screened for this virus. Talk with your health care provider to find out if you are at risk for hepatitis B infection. Hepatitis C Blood testing is recommended for: Everyone born from 1945 through 1965. Anyone with known risk factors for hepatitis C. Sexually transmitted infections (STIs) You should be screened each year for STIs, including gonorrhea and chlamydia, if: You are sexually active and are younger than 67 years of age. You are older than 67 years of age and your   health care provider tells you that you are at risk for this type of infection. Your sexual activity has changed since you were last screened, and you are at increased risk for chlamydia or gonorrhea. Ask your health care provider if you are at risk. Ask your health care provider about whether you are at high risk for HIV. Your health care provider  may recommend a prescription medicine to help prevent HIV infection. If you choose to take medicine to prevent HIV, you should first get tested for HIV. You should then be tested every 3 months for as long as you are taking the medicine. Follow these instructions at home: Alcohol use Do not drink alcohol if your health care provider tells you not to drink. If you drink alcohol: Limit how much you have to 0-2 drinks a day. Know how much alcohol is in your drink. In the [place] one drink equals one 12 oz bottle of beer (355 mL), one 5 oz glass of wine (148 mL), or one 1 oz glass of hard liquor (44 mL). Lifestyle Do not use any products that contain nicotine or tobacco. These products include cigarettes, chewing tobacco, and vaping devices, such as e-cigarettes. If you need help quitting, ask your health care provider. Do not use street drugs. Do not share needles. Ask your health care provider for help if you need support or information about quitting drugs. General instructions Schedule regular health, dental, and eye exams. Stay current with your vaccines. Tell your health care provider if: You often feel depressed. You have ever been abused or do not feel safe at home. Summary Adopting a healthy lifestyle and getting preventive care are important in promoting health and wellness. Follow your health care provider's instructions about healthy diet, exercising, and getting tested or screened for diseases. Follow your health care provider's instructions on monitoring your cholesterol and blood pressure. This information is not intended to replace advice given to you by your health care provider. Make sure you discuss any questions you have with your health care provider. Document Revised: 06/20/2020 Document Reviewed: 06/20/2020 Elsevier Patient Education  2023 [employer]  

## 2022-07-04 ENCOUNTER — Encounter (HOSPITAL_BASED_OUTPATIENT_CLINIC_OR_DEPARTMENT_OTHER): Payer: Self-pay | Admitting: Emergency Medicine

## 2022-07-04 ENCOUNTER — Emergency Department (HOSPITAL_BASED_OUTPATIENT_CLINIC_OR_DEPARTMENT_OTHER)
Admission: EM | Admit: 2022-07-04 | Discharge: 2022-07-04 | Disposition: A | Discharge status: Home / Self Care | Payer: Medicare Other | Attending: Emergency Medicine | Admitting: Emergency Medicine

## 2022-07-04 ENCOUNTER — Other Ambulatory Visit (HOSPITAL_BASED_OUTPATIENT_CLINIC_OR_DEPARTMENT_OTHER): Payer: Self-pay

## 2022-07-04 ENCOUNTER — Emergency Department (HOSPITAL_BASED_OUTPATIENT_CLINIC_OR_DEPARTMENT_OTHER): Payer: Medicare Other

## 2022-07-04 ENCOUNTER — Other Ambulatory Visit: Payer: Self-pay

## 2022-07-04 DIAGNOSIS — Z20822 Contact with and (suspected) exposure to covid-19: Secondary | ICD-10-CM | POA: Insufficient documentation

## 2022-07-04 DIAGNOSIS — H538 Other visual disturbances: Secondary | ICD-10-CM | POA: Diagnosis not present

## 2022-07-04 DIAGNOSIS — R42 Dizziness and giddiness: Secondary | ICD-10-CM | POA: Insufficient documentation

## 2022-07-04 DIAGNOSIS — I10 Essential (primary) hypertension: Secondary | ICD-10-CM | POA: Diagnosis not present

## 2022-07-04 DIAGNOSIS — R001 Bradycardia, unspecified: Secondary | ICD-10-CM | POA: Diagnosis not present

## 2022-07-04 DIAGNOSIS — R519 Headache, unspecified: Secondary | ICD-10-CM | POA: Diagnosis not present

## 2022-07-04 DIAGNOSIS — J0100 Acute maxillary sinusitis, unspecified: Secondary | ICD-10-CM | POA: Diagnosis not present

## 2022-07-04 LAB — CBC
HCT: 41.4 % (ref 39.0–52.0)
Hemoglobin: 14.4 g/dL (ref 13.0–17.0)
MCH: 31.6 pg (ref 26.0–34.0)
MCHC: 34.8 g/dL (ref 30.0–36.0)
MCV: 90.8 fL (ref 80.0–100.0)
Platelets: 262 10*3/uL (ref 150–400)
RBC: 4.56 MIL/uL (ref 4.22–5.81)
RDW: 12.8 % (ref 11.5–15.5)
WBC: 5 10*3/uL (ref 4.0–10.5)
nRBC: 0 % (ref 0.0–0.2)

## 2022-07-04 LAB — DIFFERENTIAL
Abs Immature Granulocytes: 0.01 10*3/uL (ref 0.00–0.07)
Basophils Absolute: 0 10*3/uL (ref 0.0–0.1)
Basophils Relative: 0 %
Eosinophils Absolute: 0 10*3/uL (ref 0.0–0.5)
Eosinophils Relative: 0 %
Immature Granulocytes: 0 %
Lymphocytes Relative: 35 %
Lymphs Abs: 1.8 10*3/uL (ref 0.7–4.0)
Monocytes Absolute: 0.3 10*3/uL (ref 0.1–1.0)
Monocytes Relative: 6 %
Neutro Abs: 2.9 10*3/uL (ref 1.7–7.7)
Neutrophils Relative %: 59 %

## 2022-07-04 LAB — RAPID URINE DRUG SCREEN, HOSP PERFORMED
Amphetamines: NOT DETECTED
Barbiturates: NOT DETECTED
Benzodiazepines: NOT DETECTED
Cocaine: NOT DETECTED
Opiates: NOT DETECTED
Tetrahydrocannabinol: NOT DETECTED

## 2022-07-04 LAB — URINALYSIS, ROUTINE W REFLEX MICROSCOPIC
Bilirubin Urine: NEGATIVE
Glucose, UA: NEGATIVE mg/dL
Hgb urine dipstick: NEGATIVE
Ketones, ur: NEGATIVE mg/dL
Leukocytes,Ua: NEGATIVE
Nitrite: NEGATIVE
Protein, ur: NEGATIVE mg/dL
Specific Gravity, Urine: 1.006 (ref 1.005–1.030)
pH: 5.5 (ref 5.0–8.0)

## 2022-07-04 LAB — COMPREHENSIVE METABOLIC PANEL
ALT: 10 U/L (ref 0–44)
AST: 13 U/L — ABNORMAL LOW (ref 15–41)
Albumin: 4.4 g/dL (ref 3.5–5.0)
Alkaline Phosphatase: 49 U/L (ref 38–126)
Anion gap: 8 (ref 5–15)
BUN: 16 mg/dL (ref 8–23)
CO2: 29 mmol/L (ref 22–32)
Calcium: 9.2 mg/dL (ref 8.9–10.3)
Chloride: 101 mmol/L (ref 98–111)
Creatinine, Ser: 1.31 mg/dL — ABNORMAL HIGH (ref 0.61–1.24)
GFR, Estimated: >60 mL/min (ref >60)
Glucose, Bld: 104 mg/dL — ABNORMAL HIGH (ref 70–99)
Potassium: 3.7 mmol/L (ref 3.5–5.1)
Sodium: 138 mmol/L (ref 135–145)
Total Bilirubin: 0.7 mg/dL (ref 0.3–1.2)
Total Protein: 7.5 g/dL (ref 6.5–8.1)

## 2022-07-04 LAB — CBG MONITORING, ED: Glucose-Capillary: 102 mg/dL — ABNORMAL HIGH (ref 70–99)

## 2022-07-04 LAB — RESP PANEL BY RT-PCR (RSV, FLU A&B, COVID)  RVPGX2
Influenza A by PCR: NEGATIVE
Influenza B by PCR: NEGATIVE
Resp Syncytial Virus by PCR: NEGATIVE
SARS Coronavirus 2 by RT PCR: NEGATIVE

## 2022-07-04 LAB — APTT: aPTT: 28 seconds (ref 24–36)

## 2022-07-04 LAB — PROTIME-INR
INR: 1 (ref 0.8–1.2)
Prothrombin Time: 13.1 seconds (ref 11.4–15.2)

## 2022-07-04 LAB — ETHANOL: Alcohol, Ethyl (B): <10 mg/dL (ref <10)

## 2022-07-04 MED ORDER — SODIUM CHLORIDE 0.9 % IV BOLUS
1000.0000 mL | Freq: Once | INTRAVENOUS | Status: AC
  Administered 2022-07-04: 1000 mL via INTRAVENOUS

## 2022-07-04 MED ORDER — DOXYCYCLINE HYCLATE 100 MG PO CAPS
100.0000 mg | ORAL_CAPSULE | Freq: Two times a day (BID) | ORAL | 0 refills | Status: AC
  Filled 2022-07-04: qty 14, 7d supply, fill #0

## 2022-07-04 NOTE — ED Provider Notes (Signed)
[place] EMERGENCY DEPARTMENT AT [facility] Provider Note   CSN: [id number] Arrival date & time: 07/04/22  1324     History  Chief Complaint  Patient presents with   Dizziness   Blurred Vision    Jeremiah Mitchell is a 67 y.o. male.  67 y.o male with a PMH of HTN presents to the ED with a chief complaint of visual disturbance and headache which began around 10 AM today while he was sitting in his truck.  Reports he began to feel dizzy, began to feel like he was on a boat, with his blurry vision, this episode lasted approximately 45 to 60 minutes.  He proceeded to go get a Gatorade, along with a honey bun which helped his episodes subside.Patient also had multiple teeth removed recently so he's had decrease in oral intake, has not been eating as much as he usually does. No focal weakness, no chest pain or other complaints.    The history is provided by the patient and medical records.  Dizziness Quality:  Imbalance Severity:  Moderate Onset quality:  Sudden Duration:  1 hour Associated symptoms: no chest pain, no nausea, no shortness of breath and no vomiting        Home Medications Prior to Admission medications   Medication Sig Start Date End Date Taking? Authorizing Provider  ciclopirox (PENLAC) 8 % solution Apply topically at bedtime. Apply over nail and surrounding skin. Apply daily over previous coat. After seven (7) days, may remove with alcohol and continue cycle. 03/26/22   Pincus Sanes, MD  folic acid (FOLVITE) 1 MG tablet Take 1 tablet (1 mg total) by mouth daily. 06/18/22   Etta Grandchild, MD  indapamide (LOZOL) 1.25 MG tablet Take 1 tablet (1.25 mg total) by mouth daily. 06/18/22   Etta Grandchild, MD  ketoconazole (NIZORAL) 2 % cream Apply 1 Application topically 2 (two) times daily. 07/14/22   Etta Grandchild, MD  olmesartan (BENICAR) 20 MG tablet Take 1 tablet (20 mg total) by mouth daily. 06/18/22   Etta Grandchild, MD  rosuvastatin (CRESTOR) 20 MG tablet  Take 1 tablet (20 mg total) by mouth daily. 06/18/22   Etta Grandchild, MD  triamcinolone cream (KENALOG) 0.5 % Apply 1 Application topically 2 (two) times daily. 06/18/22   Etta Grandchild, MD      Allergies    Penicillins    Review of Systems   Review of Systems  Constitutional:  Negative for chills and fever.  HENT:  Negative for sore throat.   Eyes:  Positive for visual disturbance.  Respiratory:  Negative for shortness of breath.   Cardiovascular:  Negative for chest pain.  Gastrointestinal:  Negative for abdominal pain, nausea and vomiting.  Genitourinary:  Negative for flank pain.  Musculoskeletal:  Negative for back pain.  Neurological:  Positive for dizziness.  All other systems reviewed and are negative.   Physical Exam Updated Vital Signs BP 127/72   Pulse (!) 46   Temp 98.6 F (37 C) (Oral)   Resp 13   Ht 5\' 9"  (1.753 m)   Wt 72.1 kg   SpO2 100%   BMI 23.48 kg/m  Physical Exam Vitals and nursing note reviewed.  Constitutional:      Appearance: Normal appearance.  HENT:     Head: Normocephalic and atraumatic.     Mouth/Throat:     Mouth: Mucous membranes are moist.  Eyes:     Pupils: Pupils are equal, round, and  reactive to light.  Cardiovascular:     Rate and Rhythm: Bradycardia present.  Pulmonary:     Effort: Pulmonary effort is normal.     Breath sounds: No wheezing or rales.  Abdominal:     General: Abdomen is flat.     Palpations: Abdomen is soft.     Tenderness: There is no abdominal tenderness.  Musculoskeletal:     Cervical back: Normal range of motion and neck supple.  Skin:    General: Skin is warm and dry.  Neurological:     Mental Status: He is alert and oriented to person, place, and time.     Comments: Alert, oriented, thought content appropriate. Speech fluent without evidence of aphasia. Able to follow 2 step commands without difficulty.  Cranial Nerves:  II:  Peripheral visual fields grossly normal, pupils, round, reactive to  light III,IV, VI: ptosis not present, extra-ocular motions intact bilaterally  V,VII: smile symmetric, facial light touch sensation equal VIII: hearing grossly normal bilaterally  IX,X: midline uvula rise  XI: bilateral shoulder shrug equal and strong XII: midline tongue extension  Motor:  5/5 in upper and lower extremities bilaterally including strong and equal grip strength and dorsiflexion/plantar flexion Sensory: light touch normal in all extremities.  Cerebellar: normal finger-to-nose with bilateral upper extremities, pronator drift negative Gait: normal gait and balance       ED Results / Procedures / Treatments   Labs (all labs ordered are listed, but only abnormal results are displayed) Labs Reviewed  COMPREHENSIVE METABOLIC PANEL - Abnormal; Notable for the following components:      Result Value   Glucose, Bld 104 (*)    Creatinine, Ser 1.31 (*)    AST 13 (*)    All other components within normal limits  URINALYSIS, ROUTINE W REFLEX MICROSCOPIC - Abnormal; Notable for the following components:   Color, Urine COLORLESS (*)    All other components within normal limits  CBG MONITORING, ED - Abnormal; Notable for the following components:   Glucose-Capillary 102 (*)    All other components within normal limits  RESP PANEL BY RT-PCR (RSV, FLU A&B, COVID)  RVPGX2  ETHANOL  PROTIME-INR  APTT  CBC  DIFFERENTIAL  RAPID URINE DRUG SCREEN, HOSP PERFORMED    EKG EKG Interpretation  Date/Time:  Wednesday Jul 04 2022 13:57:47 EDT Ventricular Rate:  55 PR Interval:  124 QRS Duration: 84 QT Interval:  426 QTC Calculation: 407 R Axis:   80 Text Interpretation: Sinus bradycardia Otherwise normal ECG When compared with ECG of 15-Nov-2016 13:46, No significant change was found Confirmed by Vonita Moss [phone]) on 07/04/2022 3:54:09 PM  Radiology No results found.  Procedures Procedures    Medications Ordered in ED Medications  sodium chloride 0.9 % bolus 1,000  mL (0 mLs Intravenous Stopped 07/04/22 1753)    ED Course/ Medical Decision Making/ A&P                            Medical Decision Making Risk Prescription drug management.   This patient presents to the ED for concern of visual disturbance and headache, this involves a number of treatment options, and is a complaint that carries with it a high risk of complications and morbidity.  The differential diagnosis includes CVA, TIA, versus complex migraine.    Co morbidities: Discussed in HPI   Brief History:  SEE HPI.   EMR reviewed including pt PMHx, past surgical history and past visits  to ER.   See HPI for more details   Lab Tests:  I ordered and independently interpreted labs.  The pertinent results include:    I personally reviewed all laboratory work and imaging. Metabolic panel without any acute abnormality specifically kidney function within normal limits and no significant electrolyte abnormalities. CBC without leukocytosis or significant anemia.  Imaging Studies:  NAD. I personally reviewed all imaging studies and no acute abnormality found. I agree with radiology interpretation.  Cardiac Monitoring:  The patient was maintained on a cardiac monitor.  I personally viewed and interpreted the cardiac monitored which showed an underlying rhythm of: NSR EKG non-ischemic   Medicines ordered:  I ordered medication including bolus  for hydration Reevaluation of the patient after these medicines showed that the patient improved I have reviewed the patients home medicines and have made adjustments as needed  Reevaluation:  After the interventions noted above I re-evaluated patient and found that they have :resolved  Social Determinants of Health:  The patient's social determinants of health were a factor in the care of this patient    Problem List / ED Course:  Presents to the ED after sudden onset of headache along with dizziness and visual disturbance which  occurred while he was sitting in his car, reports eating a honey bun along with drinking some Gatorade with resolution in his symptoms.  Patient recently had multiple teeth removed, has had some decrease in oral intake over the last couple days and has a mild AKI on today's visit.  He denies any cough, fever, no other signs of infection.  Neurological exam is intact.  He was evaluated by my attending Dr. Jarold Motto, CT head did not show any acute finding at this time.  Respiratory panel is negative for influenza, COVID-19.  Ethanol level is normal.  Patient feels improvement after his fluids.  Denies suspect CVA at this time.  Reassuring exam patient is hemodynamically stable for discharge.  Dispostion:  After consideration of the diagnostic results and the patients response to treatment, I feel that the patent would benefit from outpatient follow-up with primary care physician.  Treatment of his sinusitis with doxycycline.   Portions of this note were generated with Scientist, clinical (histocompatibility and immunogenetics). Dictation errors may occur despite best attempts at proofreading.   Final Clinical Impression(s) / ED Diagnoses Final diagnoses:  Dizziness  Acute maxillary sinusitis, recurrence not specified    Rx / DC Orders ED Discharge Orders          Ordered    doxycycline (VIBRAMYCIN) 100 MG capsule  2 times daily        07/04/22 1736              Claude Manges, PA-C 07/26/22 2338    Rondel Baton, MD 07/27/22 [phone]

## 2022-07-04 NOTE — Discharge Instructions (Addendum)
Your laboratories also within normal limits today.  You were given a prescription for antibiotics in order to help treat your acute sinusitis, we discussed treatment of this.  If you experience any worsening symptoms, please return to the emergency department.

## 2022-07-04 NOTE — ED Triage Notes (Signed)
Pt via pov from work with blurred vision and dizziness that began this morning around 10AM. Pt states he has blurred vision and had floaters earlier. Pt also c/o headache that started in last hour. Pt alert & oriented, nad noted. No neuro deficits noted in triage.

## 2022-07-04 NOTE — ED Provider Triage Note (Signed)
Emergency Medicine Provider Triage Evaluation Note  Jeremiah Mitchell , a 67 y.o. male  was evaluated in triage.  Pt complains of visual disturbance and headache.  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  Then developed a headache located bitemporal down to his jaw bilaterally.  Currently has a headache only, the vision, dizziness have resolved.  Review of Systems  Positive:  Negative:   Physical Exam  BP (!) 140/86 (BP Location: Right Arm)   Pulse (!) 56   Temp 98.6 F (37 C) (Oral)   Resp 18   Ht 5\' 9"  (1.753 m)   Wt 72.1 kg   SpO2 100%   BMI 23.48 kg/m  Gen:   Awake, no distress   Resp:  Normal effort  MSK:   Moves extremities without difficulty  Other:  Gait intact, no unilateral weakness or numbness, no facial asymmetry, no arm drift, symmetric leg strength  Medical Decision Making  Medically screening exam initiated at 2:07 PM.  Appropriate orders placed.  CLEE COMAR was informed that the remainder of the evaluation will be completed by another provider, this initial triage assessment does not replace that evaluation, and the importance of remaining in the ED until their evaluation is complete.     Jeannie Fend, PA-C 07/04/22 1407

## 2022-07-05 ENCOUNTER — Telehealth: Payer: Self-pay

## 2022-07-05 DIAGNOSIS — F4323 Adjustment disorder with mixed anxiety and depressed mood: Secondary | ICD-10-CM | POA: Diagnosis not present

## 2022-07-05 NOTE — Transitions of Care (Post Inpatient/ED Visit) (Signed)
   07/06/2022  Name: Jeremiah Mitchell MRN: [id number] DOB: [birth year]  Today's TOC FU Call Status: Today's TOC FU Call Status:: Successful TOC FU Call Competed TOC FU Call Complete Date: 07/06/22  Transition Care Management Follow-up Telephone Call Date of Discharge: 07/04/22 Discharge Facility: [facility]) Type of Discharge: Emergency Department Reason for ED Visit: Other: (dizziness) How have you been since you were released from the hospital?: Better Any questions or concerns?: No  Items Reviewed: Did you receive and understand the discharge instructions provided?: Yes Medications obtained,verified, and reconciled?: Yes (Medications Reviewed) Any new allergies since your discharge?: No Dietary orders reviewed?: NA  Medications Reviewed Today: Medications Reviewed Today     Reviewed by Annabell Sabal, CMA (Certified Medical Assistant) on 07/06/22 at 1324  Med List Status: <None>   Medication Order Taking? Sig Documenting Provider Last Dose Status Informant  ciclopirox (PENLAC) 8 % solution [id number] Yes Apply topically at bedtime. Apply over nail and surrounding skin. Apply daily over previous coat. After seven (7) days, may remove with alcohol and continue cycle. Pincus Sanes, MD Taking Active   doxycycline (VIBRAMYCIN) 100 MG capsule [id number] Yes Take 1 capsule (100 mg total) by mouth 2 (two) times daily for 7 days. Claude Manges, PA-C Taking Active   folic acid (FOLVITE) 1 MG tablet [id number] Yes Take 1 tablet (1 mg total) by mouth daily. Etta Grandchild, MD Taking Active   indapamide (LOZOL) 1.25 MG tablet [id number] Yes Take 1 tablet (1.25 mg total) by mouth daily. Etta Grandchild, MD Taking Active   olmesartan (BENICAR) 20 MG tablet [id number] Yes Take 1 tablet (20 mg total) by mouth daily. Etta Grandchild, MD Taking Active   rosuvastatin (CRESTOR) 20 MG tablet [id number] Yes Take 1 tablet (20 mg total) by mouth daily. Etta Grandchild, MD Taking Active   triamcinolone  cream (KENALOG) 0.5 % [id number] Yes Apply 1 Application topically 2 (two) times daily. Etta Grandchild, MD Taking Active             Home Care and Equipment/Supplies: Were Home Health Services Ordered?: No Any new equipment or medical supplies ordered?: No  Functional Questionnaire: Do you need assistance with bathing/showering or dressing?: No Do you need assistance with meal preparation?: No Do you need assistance with eating?: No Do you have difficulty maintaining continence: No Do you need assistance with getting out of bed/getting out of a chair/moving?: No Do you have difficulty managing or taking your medications?: No  Follow up appointments reviewed: PCP Follow-up appointment confirmed?: No MD Provider Line Number:[phone] Given: Yes Specialist Hospital Follow-up appointment confirmed?: NA Do you need transportation to your follow-up appointment?: No Do you understand care options if your condition(s) worsen?: Yes-patient verbalized understanding    SIGNATURE Fredirick Maudlin [facility]

## 2022-07-11 ENCOUNTER — Telehealth: Payer: Self-pay | Admitting: Internal Medicine

## 2022-07-11 NOTE — Telephone Encounter (Signed)
Patient is out of Kenalog cream that he is using on Rash.  Rash has not gotten any better.  Is there something else that can be called in or is there something that he can buy over the counter.  Please call patient and let him know.  Phone:  [phone]

## 2022-07-11 NOTE — Telephone Encounter (Signed)
Called pt inform per chart has 2 refills left on cream. Pt states pharmacist told him refill not due until June 22nd. Pt is wanting to know is there something else he can take.Marland KitchenRaechel Chute

## 2022-07-14 ENCOUNTER — Other Ambulatory Visit: Payer: Self-pay | Admitting: Internal Medicine

## 2022-07-14 DIAGNOSIS — B354 Tinea corporis: Secondary | ICD-10-CM

## 2022-07-14 MED ORDER — KETOCONAZOLE 2 % EX CREA
1.0000 | TOPICAL_CREAM | Freq: Two times a day (BID) | CUTANEOUS | 0 refills | Status: DC
Start: 2022-07-14 — End: 2022-12-19

## 2022-07-16 NOTE — Telephone Encounter (Signed)
Notified pt MD sent another cream to [facility].Marland KitchenRaechel Chute

## 2022-09-05 ENCOUNTER — Ambulatory Visit: Payer: Medicare Other | Admitting: Internal Medicine

## 2022-09-05 ENCOUNTER — Encounter: Payer: Self-pay | Admitting: Internal Medicine

## 2022-09-05 VITALS — BP 118/64 | HR 63 | Temp 98.3°F | Resp 16 | Ht 69.0 in | Wt 163.0 lb

## 2022-09-05 DIAGNOSIS — I10 Essential (primary) hypertension: Secondary | ICD-10-CM

## 2022-09-05 DIAGNOSIS — L247 Irritant contact dermatitis due to plants, except food: Secondary | ICD-10-CM | POA: Diagnosis not present

## 2022-09-05 MED ORDER — FLUOCINONIDE EMULSIFIED BASE 0.05 % EX CREA
1.0000 | TOPICAL_CREAM | Freq: Two times a day (BID) | CUTANEOUS | 1 refills | Status: DC
Start: 2022-09-05 — End: 2022-12-19

## 2022-09-05 MED ORDER — LEVOCETIRIZINE DIHYDROCHLORIDE 5 MG PO TABS
5.0000 mg | ORAL_TABLET | Freq: Every evening | ORAL | 0 refills | Status: DC
Start: 2022-09-05 — End: 2022-09-28

## 2022-09-05 NOTE — Progress Notes (Signed)
Subjective:  Patient ID: Jeremiah Mitchell, male    DOB: 01/18/1956  Age: 67 y.o. MRN: [id number]  CC: Rash and Hypertension   HPI Jeremiah Mitchell presents for f/up -----   He is active and denies chest pain, shortness of breath, diaphoresis, dizziness, lightheadedness, or edema.  He complains of a 1 week history of itchy rash on his left forearm after he was doing some work in [employer].  His wife is concerned that some lesions on his face might be skin cancer.   Outpatient Medications Prior to Visit  Medication Sig Dispense Refill   ciclopirox (PENLAC) 8 % solution Apply topically at bedtime. Apply over nail and surrounding skin. Apply daily over previous coat. After seven (7) days, may remove with alcohol and continue cycle. 6.6 mL 5   folic acid (FOLVITE) 1 MG tablet Take 1 tablet (1 mg total) by mouth daily. 90 tablet 1   ketoconazole (NIZORAL) 2 % cream Apply 1 Application topically 2 (two) times daily. 60 g 0   olmesartan (BENICAR) 20 MG tablet Take 1 tablet (20 mg total) by mouth daily. 90 tablet 1   rosuvastatin (CRESTOR) 20 MG tablet Take 1 tablet (20 mg total) by mouth daily. 90 tablet 1   indapamide (LOZOL) 1.25 MG tablet Take 1 tablet (1.25 mg total) by mouth daily. 90 tablet 1   triamcinolone cream (KENALOG) 0.5 % Apply 1 Application topically 2 (two) times daily. 60 g 2   No facility-administered medications prior to visit.    ROS Review of Systems  Constitutional:  Negative for diaphoresis and fatigue.  HENT: Negative.    Eyes: Negative.   Respiratory:  Negative for cough, chest tightness and wheezing.   Cardiovascular:  Negative for chest pain, palpitations and leg swelling.  Gastrointestinal: Negative.  Negative for abdominal pain, constipation, diarrhea, nausea and vomiting.  Endocrine: Negative.   Genitourinary: Negative.  Negative for difficulty urinating.  Musculoskeletal: Negative.  Negative for myalgias.  Skin:  Positive for color change and  rash.  Neurological:  Negative for dizziness, weakness, light-headedness and headaches.  Hematological:  Negative for adenopathy. Does not bruise/bleed easily.  Psychiatric/Behavioral: Negative.      Objective:  BP 118/64 (BP Location: Right Arm, Patient Position: Sitting, Cuff Size: Large)   Pulse 63   Temp 98.3 F (36.8 C) (Oral)   Resp 16   Ht 5\' 9"  (1.753 m)   Wt 163 lb (73.9 kg)   SpO2 96%   BMI 24.07 kg/m   BP Readings from Last 3 Encounters:  09/05/22 118/64  07/04/22 127/72  06/18/22 (!) 144/84    Wt Readings from Last 3 Encounters:  09/05/22 163 lb (73.9 kg)  07/04/22 159 lb (72.1 kg)  06/18/22 162 lb (73.5 kg)    Physical Exam Vitals reviewed.  Constitutional:      Appearance: He is not ill-appearing.  Eyes:     General: No scleral icterus.    Conjunctiva/sclera: Conjunctivae normal.  Cardiovascular:     Rate and Rhythm: Normal rate and regular rhythm.     Pulses: Normal pulses.     Heart sounds: No murmur heard. Pulmonary:     Effort: Pulmonary effort is normal.     Breath sounds: No stridor. No wheezing, rhonchi or rales.  Abdominal:     General: Abdomen is flat.     Palpations: There is no mass.     Tenderness: There is no abdominal tenderness. There is no guarding.  Hernia: No hernia is present.  Musculoskeletal:     Cervical back: Neck supple.  Lymphadenopathy:     Cervical: No cervical adenopathy.  Skin:    General: Skin is warm and dry.     Findings: Erythema and rash present.  Neurological:     General: No focal deficit present.     Mental Status: He is alert.  Psychiatric:        Mood and Affect: Mood normal.     Lab Results  Component Value Date   WBC 5.0 07/04/2022   HGB 14.4 07/04/2022   HCT 41.4 07/04/2022   PLT 262 07/04/2022   GLUCOSE 104 (H) 07/04/2022   CHOL 201 (H) 06/18/2022   TRIG 134.0 06/18/2022   HDL 39.50 06/18/2022   LDLCALC 135 (H) 06/18/2022   ALT 10 07/04/2022   AST 13 (L) 07/04/2022   NA 138  07/04/2022   K 3.7 07/04/2022   CL 101 07/04/2022   CREATININE 1.31 (H) 07/04/2022   BUN 16 07/04/2022   CO2 29 07/04/2022   TSH 4.91 06/18/2022   PSA 1.01 06/18/2022   INR 1.0 07/04/2022    CT HEAD WO CONTRAST  Result Date: 07/04/2022 CLINICAL DATA:  Headache EXAM: CT HEAD WITHOUT CONTRAST TECHNIQUE: Contiguous axial images were obtained from the base of the skull through the vertex without intravenous contrast. RADIATION DOSE REDUCTION: This exam was performed according to the departmental dose-optimization program which includes automated exposure control, adjustment of the mA and/or kV according to patient size and/or use of iterative reconstruction technique. COMPARISON:  None Available. FINDINGS: Brain: No evidence of acute infarction, hemorrhage, hydrocephalus, extra-axial collection or mass lesion/mass effect. Vascular: No hyperdense vessel or unexpected calcification. Skull: Normal. Negative for fracture or focal lesion. Sinuses/Orbits: Mucosal thickening of the bilateral maxillary sinuses and ethmoid air cells, with small maxillary air-fluid levels. Other: None. IMPRESSION: 1. No acute intracranial pathology. 2. Mucosal thickening of the bilateral maxillary sinuses and ethmoid air cells, with small maxillary air-fluid levels. Correlate for acute sinusitis. Electronically Signed   By: Jearld Lesch M.D.   On: 07/04/2022 15:47    Assessment & Plan:  Contact dermatitis and eczema due to plant -     Fluocinonide Emulsified Base; Apply 1 Application topically 2 (two) times daily.  Dispense: 60 g; Refill: 1 -     Levocetirizine Dihydrochloride; Take 1 tablet (5 mg total) by mouth every evening.  Dispense: 30 tablet; Refill: 0  Primary hypertension- His blood pressure is overcontrolled.  Will discontinue the thiazide diuretic but continue the ARB.     Follow-up: Return in about 4 months (around 01/06/2023).  Sanda Linger, MD

## 2022-09-05 NOTE — Patient Instructions (Signed)
Contact Dermatitis Dermatitis is redness, soreness, and swelling (inflammation) of the skin. Contact dermatitis is a reaction to certain substances that touch the skin. There are two types of this condition: Irritant contact dermatitis. This is the most common type. It happens when something irritates your skin, such as when your hands get dry from washing them too often with soap. You can get this type of reaction even if you have not been exposed to the irritant before. Allergic contact dermatitis. This type is caused by a substance that you are allergic to, such as poison ivy. It occurs when you have been exposed to the substance (allergen) and form a sensitivity to it. In some cases, the reaction may start soon after your first exposure to the allergen. In other cases, it may not start until you are exposed to the allergen again. It may then occur every time you are exposed to the allergen in the future. What are the causes? Irritant contact dermatitis is often caused by exposure to: Makeup. Soaps, detergents, and bleaches. Acids. Metal salts, such as nickel. Allergic contact dermatitis is often caused by exposure to: Poisonous plants. Chemicals. Jewelry. Latex. Medicines. Preservatives in products, such as clothes. What increases the risk? You are more likely to get this condition if you have: A job that exposes you to irritants or allergens. Certain medical conditions. These include asthma and eczema. What are the signs or symptoms? Symptoms of this condition may occur in any place on your body that has been touched by the irritant. Symptoms include: Dryness, flaking, or cracking. Redness. Itching. Pain or a burning feeling. Blisters. Drainage of small amounts of blood or clear fluid from skin cracks. With allergic contact dermatitis, there may also be swelling in areas such as the eyelids, mouth, or genitals. How is this diagnosed? This condition is diagnosed with a medical  history and physical exam. A patch skin test may be done to help figure out the cause. If the condition is related to your job, you may need to see an expert in health problems in the workplace (occupational medicine specialist). How is this treated? This condition is treated by staying away from the cause of the reaction and protecting your skin from further contact. Treatment may also include: Steroid creams or ointments. Steroid medicines may need be taken by mouth (orally) in more severe cases. Antibiotics or medicines applied to the skin to kill bacteria (antibacterial ointments). These may be needed if a skin infection is present. Antihistamines. These may be taken orally or put on as a lotion to ease itching. A bandage (dressing). Follow these instructions at home: Skin care Moisturize your skin as needed. Put cool, wet cloths (cool compresses) on the affected areas. Try applying baking soda paste to your skin. Stir water into baking soda until it has the consistency of a paste. Do not scratch your skin. Avoid friction to the affected area. Avoid the use of soaps, perfumes, and dyes. Check the affected areas every day for signs of infection. Check for: More redness, swelling, or pain. More fluid or blood. Warmth. Pus or a bad smell. Medicines Take or apply over-the-counter and prescription medicines only as told by your health care provider. If you were prescribed antibiotics, take or apply them as told by your health care provider. Do not stop using the antibiotic even if you start to feel better. Bathing Try taking a bath with: Epsom salts. Follow the instructions on the packaging. You can get these at your local pharmacy   or grocery store. Baking soda. Pour a small amount into the bath as told by your health care provider. Colloidal oatmeal. Follow the instructions on the packaging. You can get this at your local pharmacy or grocery store. Bathe less often. This may mean bathing  every other day. Bathe in lukewarm water. Avoid using hot water. Bandage care If you were given a dressing, change it as told by your health care provider. Wash your hands with soap and water for at least 20 seconds before and after you change your dressing. If soap and water are not available, use hand sanitizer. General instructions Avoid the substance that caused your reaction. If you do not know what caused it, keep a journal to try to track what caused it. Write down: What you eat and drink. What cosmetics you use. What you wear in the affected area. This includes jewelry. Contact a health care provider if: Your condition does not get better with treatment. Your condition gets worse. You have any signs of infection. You have a fever. You have new symptoms. Your bone or joint under the affected area becomes painful after the skin has healed. Get help right away if: You notice red streaks coming from the affected area. The affected area turns darker. You have trouble breathing. This information is not intended to replace advice given to you by your health care provider. Make sure you discuss any questions you have with your health care provider. Document Revised: 08/04/2021 Document Reviewed: 08/04/2021 Elsevier Patient Education  2024 Elsevier Inc.  

## 2022-09-17 DIAGNOSIS — F4323 Adjustment disorder with mixed anxiety and depressed mood: Secondary | ICD-10-CM | POA: Diagnosis not present

## 2022-09-28 ENCOUNTER — Other Ambulatory Visit: Payer: Self-pay | Admitting: Internal Medicine

## 2022-09-28 DIAGNOSIS — L247 Irritant contact dermatitis due to plants, except food: Secondary | ICD-10-CM

## 2022-10-03 ENCOUNTER — Ambulatory Visit (INDEPENDENT_AMBULATORY_CARE_PROVIDER_SITE_OTHER): Payer: Medicare Other

## 2022-10-03 VITALS — Ht 69.0 in | Wt 154.0 lb

## 2022-10-03 DIAGNOSIS — Z0000 Encounter for general adult medical examination without abnormal findings: Secondary | ICD-10-CM | POA: Diagnosis not present

## 2022-10-03 NOTE — Patient Instructions (Signed)
Jeremiah Mitchell , Thank you for taking time to come for your Medicare Wellness Visit. I appreciate your ongoing commitment to your health goals. Please review the following plan we discussed and let me know if I can assist you in the future.   Referrals/Orders/Follow-Ups/Clinician Recommendations: No  This is a list of the screening recommended for you and due dates:  Health Maintenance  Topic Date Due   Colon Cancer Screening  Never done   COVID-19 Vaccine (3 - 2023-24 season) 10/13/2021   Flu Shot  09/13/2022   Medicare Annual Wellness Visit  10/03/2023   DTaP/Tdap/Td vaccine (2 - Td or Tdap) 06/17/2032   Pneumonia Vaccine  Completed   Hepatitis C Screening  Completed   Zoster (Shingles) Vaccine  Completed   HPV Vaccine  Aged Out    Advanced directives: (Copy Requested) Please bring a copy of your health care power of attorney and living will to the office to be added to your chart at your convenience.  Next Medicare Annual Wellness Visit scheduled for next year: Yes

## 2022-10-03 NOTE — Progress Notes (Signed)
Subjective:   Jeremiah Mitchell is a 67 y.o. male who presents for Medicare Annual/Subsequent preventive examination.  Visit Complete: Virtual  I connected with  [name] on 10/03/22 by a audio enabled telemedicine application and verified that I am speaking with the correct person using two identifiers.  Patient Location: Home  Provider Location: Office/Clinic  I discussed the limitations of evaluation and management by telemedicine. The patient expressed understanding and agreed to proceed.  Vital Signs: Because this visit was a virtual/telehealth visit, some criteria may be missing or patient reported. Any vitals not documented were not able to be obtained and vitals that have been documented are patient reported.    Review of Systems     Cardiac Risk Factors include: advanced age (>54men, >49 women);dyslipidemia;hypertension;family history of premature cardiovascular disease;male gender     Objective:    Today's Vitals   10/03/22 0847 10/03/22 0848  Weight: 154 lb (69.9 kg)   Height: 5\' 9"  (1.753 m)   PainSc: 0-No pain 0-No pain   Body mass index is 22.74 kg/m.     10/03/2022    8:49 AM 07/04/2022    1:56 PM 09/13/2021    9:00 AM 11/15/2016    1:47 PM 11/27/2015    9:34 AM 07/21/2011   10:30 AM  Advanced Directives  Does Patient Have a Medical Advance Directive? Yes No Yes No No Patient does not have advance directive  Type of Public librarian Power of [place];Living will  Living will     Does patient want to make changes to medical advance directive?   No - Patient declined     Copy of Healthcare Power of Attorney in Chart? No - copy requested       Would patient like information on creating a medical advance directive?     No - patient declined information     Current Medications (verified) Outpatient Encounter Medications as of 10/03/2022  Medication Sig   ciclopirox (PENLAC) 8 % solution Apply topically at bedtime. Apply over nail and  surrounding skin. Apply daily over previous coat. After seven (7) days, may remove with alcohol and continue cycle.   fluocinonide-emollient (LIDEX-E) 0.05 % cream Apply 1 Application topically 2 (two) times daily.   folic acid (FOLVITE) 1 MG tablet Take 1 tablet (1 mg total) by mouth daily.   ketoconazole (NIZORAL) 2 % cream Apply 1 Application topically 2 (two) times daily.   levocetirizine (XYZAL) 5 MG tablet TAKE 1 TABLET BY MOUTH EVERY DAY IN THE EVENING   olmesartan (BENICAR) 20 MG tablet Take 1 tablet (20 mg total) by mouth daily.   rosuvastatin (CRESTOR) 20 MG tablet Take 1 tablet (20 mg total) by mouth daily.   No facility-administered encounter medications on file as of 10/03/2022.    Allergies (verified) Penicillins   History: Past Medical History:  Diagnosis Date   Allergy    Depression    Hypertension    Past Surgical History:  Procedure Laterality Date   APPENDECTOMY     Family History  Problem Relation Age of Onset   Parkinson's disease Mother    Colon cancer Mother    Breast cancer Mother    Heart attack Father    Colon cancer Maternal Aunt    Colon cancer Maternal Uncle    Social History   Socioeconomic History   Marital status: Married    Spouse name: Not on file   Number of children: Not on file   Years of education:  Not on file   Highest education level: Not on file  Occupational History   Not on file  Tobacco Use   Smoking status: Never   Smokeless tobacco: Never  Vaping Use   Vaping status: Never Used  Substance and Sexual Activity   Alcohol use: No   Drug use: No   Sexual activity: Yes  Other Topics Concern   Not on file  Social History Narrative   Not on file   Social Determinants of Health   Financial Resource Strain: Low Risk  (10/03/2022)   Overall Financial Resource Strain (CARDIA)    Difficulty of Paying Living Expenses: Not hard at all  Food Insecurity: No Food Insecurity (10/03/2022)   Hunger Vital Sign    Worried About  Running Out of Food in the Last Year: Never true    Ran Out of Food in the Last Year: Never true  Transportation Needs: No Transportation Needs (10/03/2022)   PRAPARE - Administrator, Civil Service (Medical): No    Lack of Transportation (Non-Medical): No  Physical Activity: Sufficiently Active (10/03/2022)   Exercise Vital Sign    Days of Exercise per Week: 5 days    Minutes of Exercise per Session: 60 min  Stress: No Stress Concern Present (10/03/2022)   [employer] of Occupational Health - Occupational Stress Questionnaire    Feeling of Stress : Not at all  Social Connections: Socially Integrated (10/03/2022)   Social Connection and Isolation Panel [NHANES]    Frequency of Communication with Friends and Family: More than three times a week    Frequency of Social Gatherings with Friends and Family: More than three times a week    Attends Religious Services: More than 4 times per year    Active Member of [employer] or Organizations: Yes    Attends Engineer, structural: More than 4 times per year    Marital Status: Married    Tobacco Counseling Counseling given: Not Answered   Clinical Intake:  Pre-visit preparation completed: Yes  Pain : No/denies pain Pain Score: 0-No pain     BMI - recorded: 22.74 Nutritional Status: BMI of 19-24  Normal Nutritional Risks: None Diabetes: No  How often do you need to have someone help you when you read instructions, pamphlets, or other written materials from your doctor or pharmacy?: 1 - Never What is the last grade level you completed in school?: HSG  Interpreter Needed?: No  Information entered by :: Dashonna Chagnon N. Azarria Balint, LPN.   Activities of Daily Living    10/03/2022    8:52 AM  In your present state of health, do you have any difficulty performing the following activities:  Hearing? 0  Vision? 0  Difficulty concentrating or making decisions? 0  Walking or climbing stairs? 0  Dressing or bathing? 0   Doing errands, shopping? 0  Preparing Food and eating ? N  Using the Toilet? N  In the past six months, have you accidently leaked urine? N  Do you have problems with loss of bowel control? N  Managing your Medications? N  Managing your Finances? N  Housekeeping or managing your Housekeeping? N    Patient Care Team: Etta Grandchild, MD as PCP - General (Internal Medicine) [facility] as Therapist, music (Optometry)  Indicate any recent Medical Services you may have received from other than [facility] providers in the past year (date may be approximate).     Assessment:   This is a routine  wellness examination for Jeremiah Mitchell.  Hearing/Vision screen Hearing Screening - Comments:: Denies hearing difficulties.   Vision Screening - Comments:: Wears otc glasses - up to date with routine eye exams with [facility]   Dietary issues and exercise activities discussed:     Goals Addressed   None   Depression Screen    10/03/2022    8:53 AM 03/26/2022    8:00 AM 09/13/2021    8:59 AM 01/03/2021    8:06 AM 12/12/2016    8:50 AM  PHQ 2/9 Scores  PHQ - 2 Score 0 0 0 0 0  PHQ- 9 Score 0 0       Fall Risk    10/03/2022    8:51 AM 03/26/2022    8:00 AM 09/13/2021    9:04 AM  Fall Risk   Falls in the past year? 0 0 0  Number falls in past yr: 0 0 0  Injury with Fall? 0 0 0  Risk for fall due to : No Fall Risks No Fall Risks No Fall Risks  Follow up Falls prevention discussed Falls evaluation completed Falls evaluation completed    MEDICARE RISK AT HOME: Medicare Risk at Home Any stairs in or around the home?: No If so, are there any without handrails?: No Home free of loose throw rugs in walkways, pet beds, electrical cords, etc?: Yes Adequate lighting in your home to reduce risk of falls?: Yes Life alert?: No Use of a cane, walker or w/c?: No Grab bars in the bathroom?: Yes Shower chair or bench in shower?: Yes Elevated toilet seat or a handicapped  toilet?: No  TIMED UP AND GO:  Was the test performed?  No    Cognitive Function:        10/03/2022    8:52 AM 09/13/2021    9:05 AM  6CIT Screen  What Year? 0 points 0 points  What month? 0 points 0 points  What time? 0 points 0 points  Count back from 20 0 points 0 points  Months in reverse 0 points 0 points  Repeat phrase 0 points 0 points  Total Score 0 points 0 points    Immunizations Immunization History  Administered Date(s) Administered   Fluad Quad(high Dose 65+) 01/03/2021   Influenza, High Dose Seasonal PF 11/01/2021   Moderna Sars-Covid-2 Vaccination 04/17/2019, 05/13/2019   PNEUMOCOCCAL CONJUGATE-20 01/03/2021   Tdap 06/18/2022   Zoster Recombinant(Shingrix) 04/06/2021, 09/24/2021    TDAP status: Up to date  Flu Vaccine status: Up to date  Pneumococcal vaccine status: Up to date  Covid-19 vaccine status: Completed vaccines  Qualifies for Shingles Vaccine? Yes   Zostavax completed No   Shingrix Completed?: Yes  Screening Tests Health Maintenance  Topic Date Due   Colonoscopy  Never done   COVID-19 Vaccine (3 - 2023-24 season) 10/13/2021   INFLUENZA VACCINE  09/13/2022   Medicare Annual Wellness (AWV)  10/03/2023   DTaP/Tdap/Td (2 - Td or Tdap) 06/17/2032   Pneumonia Vaccine 71+ Years old  Completed   Hepatitis C Screening  Completed   Zoster Vaccines- Shingrix  Completed   HPV VACCINES  Aged Out    Health Maintenance  Health Maintenance Due  Topic Date Due   Colonoscopy  Never done   COVID-19 Vaccine (3 - 2023-24 season) 10/13/2021   INFLUENZA VACCINE  09/13/2022    Colorectal Cancer screening: Never done.  Lung Cancer Screening: (Low Dose CT Chest recommended if Age 60-80 years, 20 pack-year currently smoking OR  have quit w/in 15years.) does not qualify.   Lung Cancer Screening Referral: no  Additional Screening:  Hepatitis C Screening: does qualify; Completed 04/06/2021  Vision Screening: Recommended annual ophthalmology exams  for early detection of glaucoma and other disorders of the eye. Is the patient up to date with their annual eye exam?  Yes  Who is the provider or what is the name of the office in which the patient attends annual eye exams? [facility] If pt is not established with a provider, would they like to be referred to a provider to establish care? No .   Dental Screening: Recommended annual dental exams for proper oral hygiene  Diabetic Foot Exam: N/A  Community Resource Referral / Chronic Care Management: CRR required this visit?  No   CCM required this visit?  No     Plan:     I have personally reviewed and noted the following in the patient's chart:   Medical and social history Use of alcohol, tobacco or illicit drugs  Current medications and supplements including opioid prescriptions. Patient is not currently taking opioid prescriptions. Functional ability and status Nutritional status Physical activity Advanced directives List of other physicians Hospitalizations, surgeries, and ER visits in previous 12 months Vitals Screenings to include cognitive, depression, and falls Referrals and appointments  In addition, I have reviewed and discussed with patient certain preventive protocols, quality metrics, and best practice recommendations. A written personalized care plan for preventive services as well as general preventive health recommendations were provided to patient.     Mickeal Needy, LPN   1/61/0960   After Visit Summary: (Mail) Due to this being a telephonic visit, the after visit summary with patients personalized plan was offered to patient via mail   Nurse Notes: Normal cognitive status assessed by direct observation via telephone conversation by this Nurse Health Advisor. No abnormalities found.

## 2022-10-16 DIAGNOSIS — F411 Generalized anxiety disorder: Secondary | ICD-10-CM | POA: Diagnosis not present

## 2022-10-16 DIAGNOSIS — F4323 Adjustment disorder with mixed anxiety and depressed mood: Secondary | ICD-10-CM | POA: Diagnosis not present

## 2022-12-19 ENCOUNTER — Encounter: Payer: Self-pay | Admitting: Internal Medicine

## 2022-12-19 ENCOUNTER — Ambulatory Visit: Payer: Medicare Other | Admitting: Internal Medicine

## 2022-12-19 ENCOUNTER — Other Ambulatory Visit: Payer: Self-pay | Admitting: Internal Medicine

## 2022-12-19 VITALS — BP 144/82 | HR 50 | Temp 97.7°F | Ht 69.0 in | Wt 162.2 lb

## 2022-12-19 DIAGNOSIS — E538 Deficiency of other specified B group vitamins: Secondary | ICD-10-CM | POA: Diagnosis not present

## 2022-12-19 DIAGNOSIS — E785 Hyperlipidemia, unspecified: Secondary | ICD-10-CM

## 2022-12-19 DIAGNOSIS — I10 Essential (primary) hypertension: Secondary | ICD-10-CM

## 2022-12-19 DIAGNOSIS — N1831 Chronic kidney disease, stage 3a: Secondary | ICD-10-CM

## 2022-12-19 DIAGNOSIS — R001 Bradycardia, unspecified: Secondary | ICD-10-CM | POA: Diagnosis not present

## 2022-12-19 DIAGNOSIS — D510 Vitamin B12 deficiency anemia due to intrinsic factor deficiency: Secondary | ICD-10-CM

## 2022-12-19 DIAGNOSIS — Z23 Encounter for immunization: Secondary | ICD-10-CM

## 2022-12-19 LAB — CBC WITH DIFFERENTIAL/PLATELET
Basophils Absolute: 0 10*3/uL (ref 0.0–0.1)
Basophils Relative: 0.5 % (ref 0.0–3.0)
Eosinophils Absolute: 0.1 10*3/uL (ref 0.0–0.7)
Eosinophils Relative: 2 % (ref 0.0–5.0)
HCT: 43.5 % (ref 39.0–52.0)
Hemoglobin: 14.7 g/dL (ref 13.0–17.0)
Lymphocytes Relative: 31.7 % (ref 12.0–46.0)
Lymphs Abs: 1.9 10*3/uL (ref 0.7–4.0)
MCHC: 33.8 g/dL (ref 30.0–36.0)
MCV: 92 fL (ref 78.0–100.0)
Monocytes Absolute: 0.5 10*3/uL (ref 0.1–1.0)
Monocytes Relative: 8.1 % (ref 3.0–12.0)
Neutro Abs: 3.4 10*3/uL (ref 1.4–7.7)
Neutrophils Relative %: 57.7 % (ref 43.0–77.0)
Platelets: 317 10*3/uL (ref 150.0–400.0)
RBC: 4.73 Mil/uL (ref 4.22–5.81)
RDW: 13 % (ref 11.5–15.5)
WBC: 5.9 10*3/uL (ref 4.0–10.5)

## 2022-12-19 LAB — BASIC METABOLIC PANEL
BUN: 12 mg/dL (ref 6–23)
CO2: 27 meq/L (ref 19–32)
Calcium: 9.4 mg/dL (ref 8.4–10.5)
Chloride: 104 meq/L (ref 96–112)
Creatinine, Ser: 1.28 mg/dL (ref 0.40–1.50)
GFR: 58.03 mL/min — ABNORMAL LOW (ref >60.00)
Glucose, Bld: 102 mg/dL — ABNORMAL HIGH (ref 70–99)
Potassium: 4.3 meq/L (ref 3.5–5.1)
Sodium: 139 meq/L (ref 135–145)

## 2022-12-19 LAB — VITAMIN B12: Vitamin B-12: 214 pg/mL (ref 211–911)

## 2022-12-19 LAB — FOLATE: Folate: >24.2 ng/mL (ref >5.9)

## 2022-12-19 MED ORDER — OLMESARTAN MEDOXOMIL 20 MG PO TABS: 20.0000 mg | ORAL_TABLET | Freq: Every day | ORAL | 1 refills | Status: DC

## 2022-12-19 MED ORDER — ROSUVASTATIN CALCIUM 20 MG PO TABS: 20.0000 mg | ORAL_TABLET | Freq: Every day | ORAL | 1 refills | Status: DC

## 2022-12-19 NOTE — Patient Instructions (Signed)
Hypertension, Adult High blood pressure (hypertension) is when the force of blood pumping through the arteries is too strong. The arteries are the blood vessels that carry blood from the heart throughout the body. Hypertension forces the heart to work harder to pump blood and may cause arteries to become narrow or stiff. Untreated or uncontrolled hypertension can lead to a heart attack, heart failure, a stroke, kidney disease, and other problems. A blood pressure reading consists of a higher number over a lower number. Ideally, your blood pressure should be below 120/80. The first ("top") number is called the systolic pressure. It is a measure of the pressure in your arteries as your heart beats. The second ("bottom") number is called the diastolic pressure. It is a measure of the pressure in your arteries as the heart relaxes. What are the causes? The exact cause of this condition is not known. There are some conditions that result in high blood pressure. What increases the risk? Certain factors may make you more likely to develop high blood pressure. Some of these risk factors are under your control, including: Smoking. Not getting enough exercise or physical activity. Being overweight. Having too much fat, sugar, calories, or salt (sodium) in your diet. Drinking too much alcohol. Other risk factors include: Having a personal history of heart disease, diabetes, high cholesterol, or kidney disease. Stress. Having a family history of high blood pressure and high cholesterol. Having obstructive sleep apnea. Age. The risk increases with age. What are the signs or symptoms? High blood pressure may not cause symptoms. Very high blood pressure (hypertensive crisis) may cause: Headache. Fast or irregular heartbeats (palpitations). Shortness of breath. Nosebleed. Nausea and vomiting. Vision changes. Severe chest pain, dizziness, and seizures. How is this diagnosed? This condition is diagnosed by  measuring your blood pressure while you are seated, with your arm resting on a flat surface, your legs uncrossed, and your feet flat on the floor. The cuff of the blood pressure monitor will be placed directly against the skin of your upper arm at the level of your heart. Blood pressure should be measured at least twice using the same arm. Certain conditions can cause a difference in blood pressure between your right and left arms. If you have a high blood pressure reading during one visit or you have normal blood pressure with other risk factors, you may be asked to: Return on a different day to have your blood pressure checked again. Monitor your blood pressure at home for 1 week or longer. If you are diagnosed with hypertension, you may have other blood or imaging tests to help your health care provider understand your overall risk for other conditions. How is this treated? This condition is treated by making healthy lifestyle changes, such as eating healthy foods, exercising more, and reducing your alcohol intake. You may be referred for counseling on a healthy diet and physical activity. Your health care provider may prescribe medicine if lifestyle changes are not enough to get your blood pressure under control and if: Your systolic blood pressure is above 130. Your diastolic blood pressure is above 80. Your personal target blood pressure may vary depending on your medical conditions, your age, and other factors. Follow these instructions at home: Eating and drinking  Eat a diet that is high in fiber and potassium, and low in sodium, added sugar, and fat. An example of this eating plan is called the DASH diet. DASH stands for Dietary Approaches to Stop Hypertension. To eat this way: Eat   plenty of fresh fruits and vegetables. Try to fill one half of your plate at each meal with fruits and vegetables. Eat whole grains, such as whole-wheat pasta, brown rice, or whole-grain bread. Fill about one  fourth of your plate with whole grains. Eat or drink low-fat dairy products, such as skim milk or low-fat yogurt. Avoid fatty cuts of meat, processed or cured meats, and poultry with skin. Fill about one fourth of your plate with lean proteins, such as fish, chicken without skin, beans, eggs, or tofu. Avoid pre-made and processed foods. These tend to be higher in sodium, added sugar, and fat. Reduce your daily sodium intake. Many people with hypertension should eat less than 1,500 mg of sodium a day. Do not drink alcohol if: Your health care provider tells you not to drink. You are pregnant, may be pregnant, or are planning to become pregnant. If you drink alcohol: Limit how much you have to: 0-1 drink a day for women. 0-2 drinks a day for men. Know how much alcohol is in your drink. In the [place] one drink equals one 12 oz bottle of beer (355 mL), one 5 oz glass of wine (148 mL), or one 1 oz glass of hard liquor (44 mL). Lifestyle  Work with your health care provider to maintain a healthy body weight or to lose weight. Ask what an ideal weight is for you. Get at least 30 minutes of exercise that causes your heart to beat faster (aerobic exercise) most days of the week. Activities may include walking, swimming, or biking. Include exercise to strengthen your muscles (resistance exercise), such as Pilates or lifting weights, as part of your weekly exercise routine. Try to do these types of exercises for 30 minutes at least 3 days a week. Do not use any products that contain nicotine or tobacco. These products include cigarettes, chewing tobacco, and vaping devices, such as e-cigarettes. If you need help quitting, ask your health care provider. Monitor your blood pressure at home as told by your health care provider. Keep all follow-up visits. This is important. Medicines Take over-the-counter and prescription medicines only as told by your health care provider. Follow directions carefully. Blood  pressure medicines must be taken as prescribed. Do not skip doses of blood pressure medicine. Doing this puts you at risk for problems and can make the medicine less effective. Ask your health care provider about side effects or reactions to medicines that you should watch for. Contact a health care provider if you: Think you are having a reaction to a medicine you are taking. Have headaches that keep coming back (recurring). Feel dizzy. Have swelling in your ankles. Have trouble with your vision. Get help right away if you: Develop a severe headache or confusion. Have unusual weakness or numbness. Feel faint. Have severe pain in your chest or abdomen. Vomit repeatedly. Have trouble breathing. These symptoms may be an emergency. Get help right away. Call 911. Do not wait to see if the symptoms will go away. Do not drive yourself to the hospital. Summary Hypertension is when the force of blood pumping through your arteries is too strong. If this condition is not controlled, it may put you at risk for serious complications. Your personal target blood pressure may vary depending on your medical conditions, your age, and other factors. For most people, a normal blood pressure is less than 120/80. Hypertension is treated with lifestyle changes, medicines, or a combination of both. Lifestyle changes include losing weight, eating a healthy,   low-sodium diet, exercising more, and limiting alcohol. This information is not intended to replace advice given to you by your health care provider. Make sure you discuss any questions you have with your health care provider. Document Revised: 12/06/2020 Document Reviewed: 12/06/2020 Elsevier Patient Education  2024 [employer]  

## 2022-12-19 NOTE — Progress Notes (Signed)
Subjective:  Patient ID: Jeremiah Mitchell, male    DOB: November 14, 1955  Age: 67 y.o. MRN: [id number]  CC: Anemia and Hypertension   HPI [name] presents for f/up ----  He is active and denies DOE, CP, SOB, edema.  Discussed the use of AI scribe software for clinical note transcription with the patient, who gave verbal consent to proceed.  History of Present Illness   The patient, with a history of hypertension and hyperlipidemia, reports feeling well overall. He has been undergoing dental work recently but denies any associated complications. He denies taking any vitamin supplements, including folic acid, which was previously discontinued.  The patient denies any symptoms of anemia such as weakness, fatigue, or shortness of breath. He also denies any neurological symptoms such as numbness, weakness, or tingling.       Outpatient Medications Prior to Visit  Medication Sig Dispense Refill   olmesartan (BENICAR) 20 MG tablet Take 1 tablet (20 mg total) by mouth daily. 90 tablet 1   rosuvastatin (CRESTOR) 20 MG tablet Take 1 tablet (20 mg total) by mouth daily. 90 tablet 1   ciclopirox (PENLAC) 8 % solution Apply topically at bedtime. Apply over nail and surrounding skin. Apply daily over previous coat. After seven (7) days, may remove with alcohol and continue cycle. 6.6 mL 5   fluocinonide-emollient (LIDEX-E) 0.05 % cream Apply 1 Application topically 2 (two) times daily. 60 g 1   folic acid (FOLVITE) 1 MG tablet Take 1 tablet (1 mg total) by mouth daily. 90 tablet 1   ketoconazole (NIZORAL) 2 % cream Apply 1 Application topically 2 (two) times daily. 60 g 0   levocetirizine (XYZAL) 5 MG tablet TAKE 1 TABLET BY MOUTH EVERY DAY IN THE EVENING 90 tablet 1   No facility-administered medications prior to visit.    ROS Review of Systems  Constitutional:  Positive for unexpected weight change (wt gain). Negative for chills, diaphoresis and fatigue.  HENT: Negative.     Respiratory: Negative.  Negative for cough, chest tightness, shortness of breath and wheezing.   Cardiovascular:  Negative for chest pain, palpitations and leg swelling.  Gastrointestinal:  Negative for abdominal pain, constipation, diarrhea, nausea and vomiting.  Genitourinary: Negative.  Negative for difficulty urinating.  Musculoskeletal: Negative.  Negative for arthralgias and myalgias.  Skin: Negative.  Negative for color change and pallor.  Neurological:  Negative for dizziness, syncope, weakness and light-headedness.  Hematological:  Negative for adenopathy. Does not bruise/bleed easily.  Psychiatric/Behavioral: Negative.      Objective:  BP (!) 144/82 (BP Location: Left Arm, Patient Position: Sitting, Cuff Size: Normal)   Pulse (!) 50   Temp 97.7 F (36.5 C) (Oral)   Ht 5\' 9"  (1.753 m)   Wt 162 lb 3.2 oz (73.6 kg)   SpO2 97%   BMI 23.95 kg/m   BP Readings from Last 3 Encounters:  12/19/22 (!) 144/82  09/05/22 118/64  07/04/22 127/72    Wt Readings from Last 3 Encounters:  12/19/22 162 lb 3.2 oz (73.6 kg)  10/03/22 154 lb (69.9 kg)  09/05/22 163 lb (73.9 kg)    Physical Exam Vitals reviewed.  Constitutional:      Appearance: Normal appearance.  HENT:     Mouth/Throat:     Mouth: Mucous membranes are moist.  Eyes:     General: No scleral icterus.    Conjunctiva/sclera: Conjunctivae normal.  Cardiovascular:     Rate and Rhythm: Regular rhythm. Bradycardia present.  Pulses: Normal pulses.     Heart sounds: No murmur heard.    No friction rub. No gallop.  Pulmonary:     Effort: Pulmonary effort is normal.     Breath sounds: No stridor. No wheezing, rhonchi or rales.  Abdominal:     General: Abdomen is flat.     Palpations: There is no mass.     Tenderness: There is no abdominal tenderness. There is no guarding.     Hernia: No hernia is present.  Musculoskeletal:        General: Normal range of motion.     Cervical back: Neck supple.     Right lower  leg: No edema.     Left lower leg: No edema.  Skin:    General: Skin is warm and dry.  Neurological:     General: No focal deficit present.     Mental Status: He is alert. Mental status is at baseline.  Psychiatric:        Mood and Affect: Mood normal.        Behavior: Behavior normal.     Lab Results  Component Value Date   WBC 5.9 12/19/2022   HGB 14.7 12/19/2022   HCT 43.5 12/19/2022   PLT 317.0 12/19/2022   GLUCOSE 102 (H) 12/19/2022   CHOL 201 (H) 06/18/2022   TRIG 134.0 06/18/2022   HDL 39.50 06/18/2022   LDLCALC 135 (H) 06/18/2022   ALT 10 07/04/2022   AST 13 (L) 07/04/2022   NA 139 12/19/2022   K 4.3 12/19/2022   CL 104 12/19/2022   CREATININE 1.28 12/19/2022   BUN 12 12/19/2022   CO2 27 12/19/2022   TSH 4.91 06/18/2022   PSA 1.01 06/18/2022   INR 1.0 07/04/2022    CT HEAD WO CONTRAST  Result Date: 07/04/2022 CLINICAL DATA:  Headache EXAM: CT HEAD WITHOUT CONTRAST TECHNIQUE: Contiguous axial images were obtained from the base of the skull through the vertex without intravenous contrast. RADIATION DOSE REDUCTION: This exam was performed according to the departmental dose-optimization program which includes automated exposure control, adjustment of the mA and/or kV according to patient size and/or use of iterative reconstruction technique. COMPARISON:  None Available. FINDINGS: Brain: No evidence of acute infarction, hemorrhage, hydrocephalus, extra-axial collection or mass lesion/mass effect. Vascular: No hyperdense vessel or unexpected calcification. Skull: Normal. Negative for fracture or focal lesion. Sinuses/Orbits: Mucosal thickening of the bilateral maxillary sinuses and ethmoid air cells, with small maxillary air-fluid levels. Other: None. IMPRESSION: 1. No acute intracranial pathology. 2. Mucosal thickening of the bilateral maxillary sinuses and ethmoid air cells, with small maxillary air-fluid levels. Correlate for acute sinusitis. Electronically Signed   By:  Jearld Lesch M.D.   On: 07/04/2022 15:47    Assessment & Plan:   Primary hypertension- He has not achieved his BP goal. He is working on his lifestyle modifications. -     Basic metabolic panel; Future -     Olmesartan Medoxomil; Take 1 tablet (20 mg total) by mouth daily.  Dispense: 90 tablet; Refill: 1  Vitamin B12 deficiency anemia due to intrinsic factor deficiency -     CBC with Differential/Platelet; Future -     Vitamin B12; Future -     Folate; Future  Dietary folate deficiency -     CBC with Differential/Platelet; Future -     Vitamin B12; Future -     Folate; Future  Bradycardia- He is asx with this.  Need for immunization against influenza -  Flu Vaccine Trivalent High Dose (Fluad)  Hyperlipidemia LDL goal <130- LDL goal achieved. Doing well on the statin  -     Rosuvastatin Calcium; Take 1 tablet (20 mg total) by mouth daily.  Dispense: 90 tablet; Refill: 1  Stage 3a chronic kidney disease (HCC)- Will avoid nephrotoxic agents.     Follow-up: Return in about 6 months (around 06/18/2023).  Sanda Linger, MD

## 2023-02-20 IMAGING — CT CT CARDIAC CORONARY ARTERY CALCIUM SCORE
3 series · 14 of 20 positions shown, 16 images · non-contrast
Comparison: None.
COMPARISON: None.

Addendum:
EXAM:
OVER-READ INTERPRETATION  CT CHEST

The following report is an over-read performed by radiologist Dr.
Onika Akter Boroduki [REDACTED] on 06/02/2021. This
over-read does not include interpretation of cardiac or coronary
anatomy or pathology. The coronary calcium score interpretation by
the cardiologist is attached.
TECHNIQUE: A gated, non-contrast computed tomography scan of the heart was
performed using 3mm slice thickness. Axial images were analyzed on a
dedicated workstation. Calcium scoring of the coronary arteries was
performed using the Agatston method.

[Series 2: cascseq 2.0 sa36 (id) (id) · axial · 0.37mm/px · z∈[-252,-162]mm · 4 of 76 slices shown]
[im 16/76  vessel]
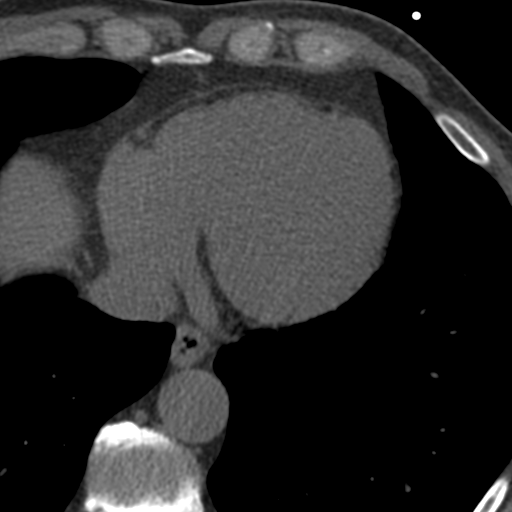
[im 31/76  vessel]
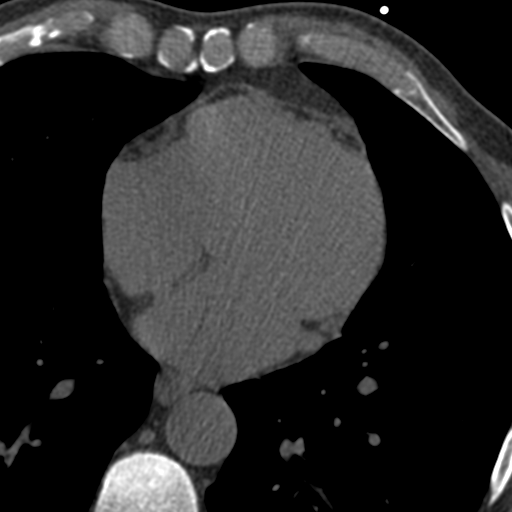
[im 46/76  vessel]
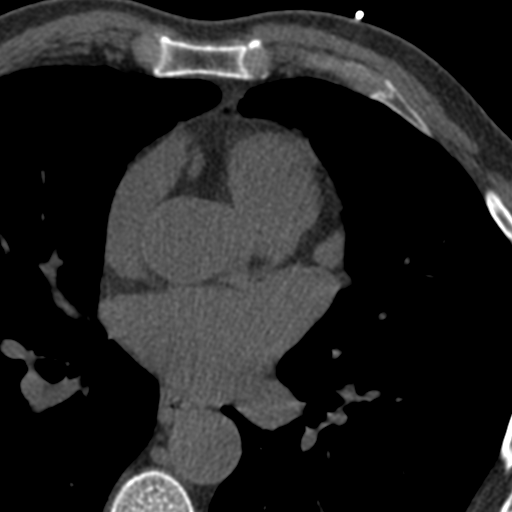
[im 61/76  vessel]
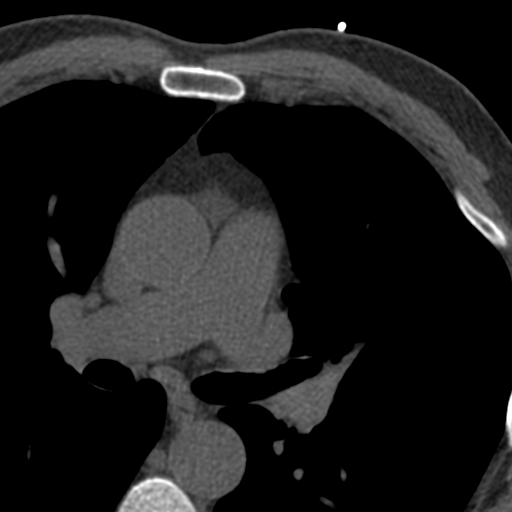

[Series 3: cascseq 2.0 bf37 st · axial · 0.67mm/px · z∈[-258,-158]mm · 5 of 76 slices shown, 7 images]
[im 13/76  vessel]
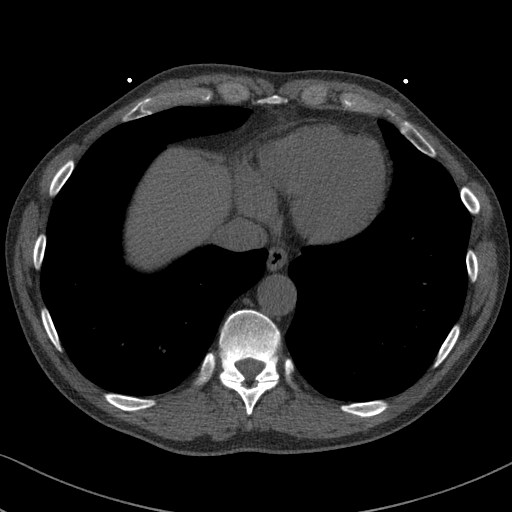
[im 13/76  lung]
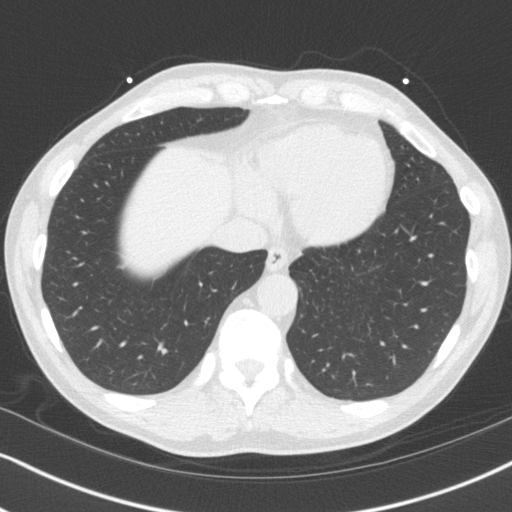
[im 26/76  vessel]
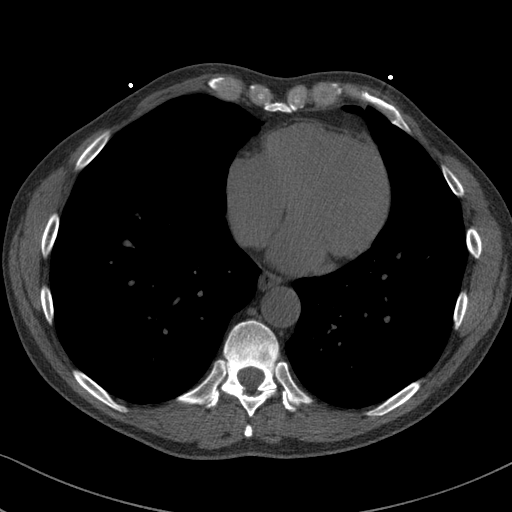
[im 38/76  vessel]
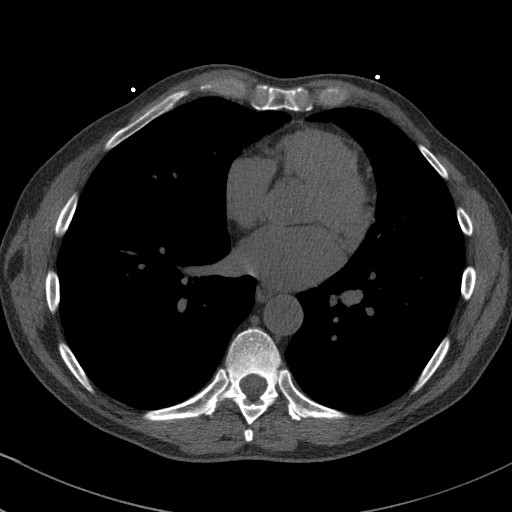
[im 51/76  vessel]
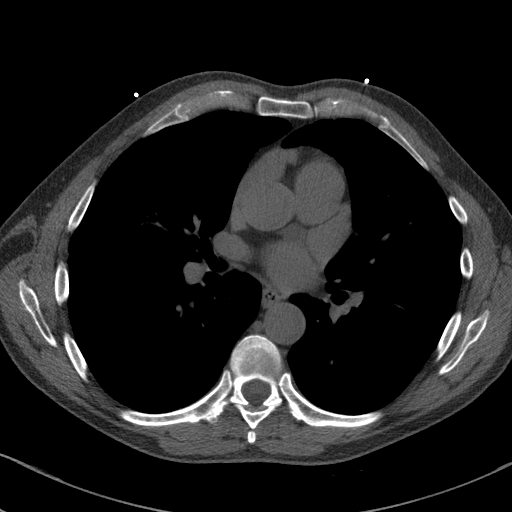
[im 63/76  vessel]
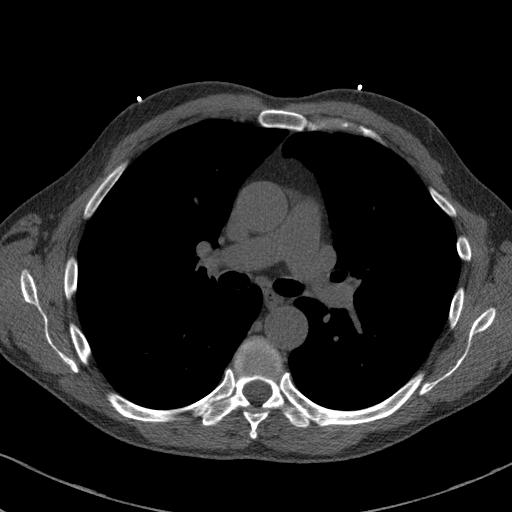
[im 63/76  lung]
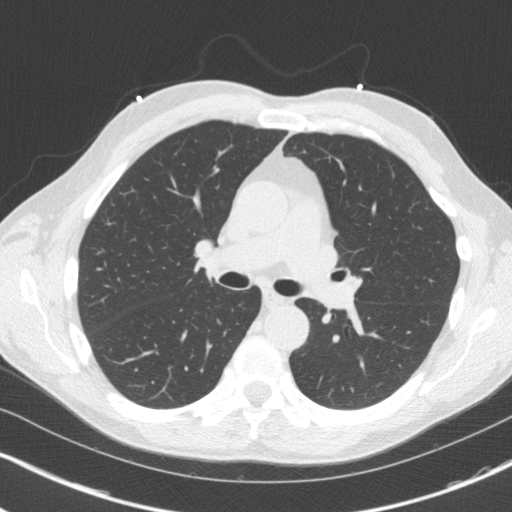

[Series 4: cascseq 2.0 br59 lung · axial · 0.67mm/px · z∈[-258,-158]mm · 5 of 76 slices shown]
[im 13/76  lung]
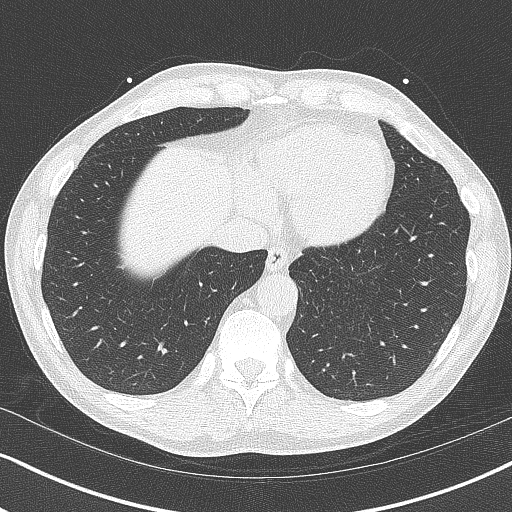
[im 26/76  lung]
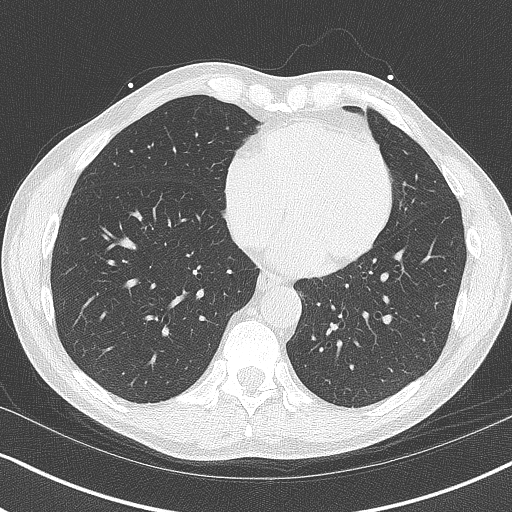
[im 38/76  lung]
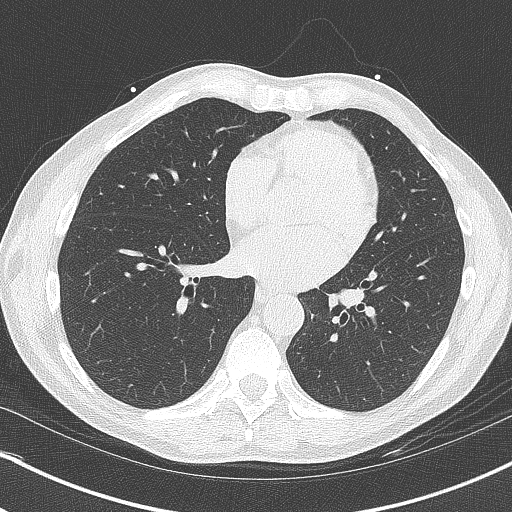
[im 51/76  lung]
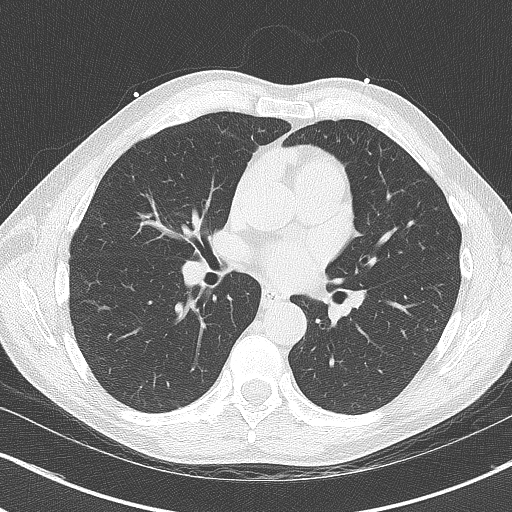
[im 63/76  lung]
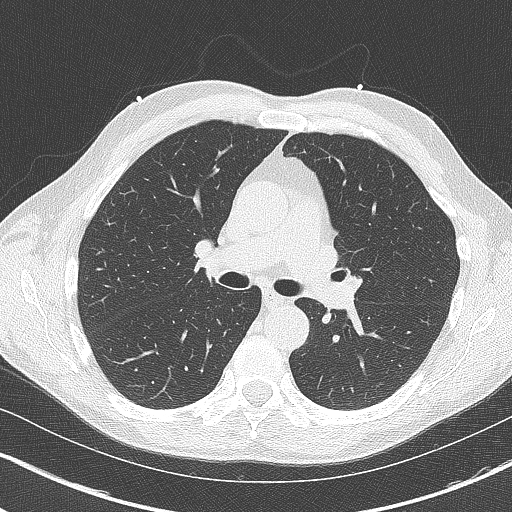

[14 of 20 positions shown; findings below may reference images not displayed]

FINDINGS: Within the visualized portions of the thorax there are no suspicious
appearing pulmonary nodules or masses, there is no acute
consolidative airspace disease, no pleural effusions, no
pneumothorax and no lymphadenopathy. Visualized portions of the
upper abdomen are unremarkable. There are no aggressive appearing
lytic or blastic lesions noted in the visualized portions of the
skeleton.
IMPRESSION: 1. No significant incidental noncardiac findings are noted.

ADDENDUM:
Cardiovascular Disease Risk stratification

EXAM:
Coronary Calcium Score
FINDINGS: Coronary arteries: Normal origins.

Coronary Calcium Score:

Left main: 0

Left anterior descending artery: 0

Left circumflex artery: 0

Right coronary artery: 0

Total: 0

Percentile: 0

Pericardium: Normal.

Ascending Aorta: Normal caliber.

Non-cardiac: See separate report from [REDACTED].
IMPRESSION: Coronary calcium score of 0. This was 0 percentile for age-, race-,
and sex-matched controls.



If CAC=0, it is reasonable to withhold statin therapy and reassess
in 5 to 10 years, as long as higher risk conditions are absent
(diabetes mellitus, family history of premature CHD in first degree
relatives (males <55 years; females <65 years), cigarette smoking,
or LDL >=190 mg/dL).

If CAC is 1 to 99, it is reasonable to initiate statin therapy for
patients >=55 years of age.

If CAC is >=100 or >=75th percentile, it is reasonable to initiate
statin therapy at any age.

Cardiology referral should be considered for patients with CAC
scores >=400 or >=75th percentile.

*3679 AHA/ACC/AACVPR/AAPA/ABC/VIESIA/MAXIME/KYAZAM/Jade/EDSERSON/BENRABAH/UEDA
Guideline on the Management of Blood Cholesterol: A Report of the
American College of Cardiology/American Heart Association Task Force
on Clinical Practice Guidelines. J Am Coll Cardiol.
0294;73(24):2738-2295.

Kiri Jim, DO

The noncardiac portion of this study will be interpreted in separate
report by the radiologist.

*** End of Addendum ***
EXAM:
OVER-READ INTERPRETATION  CT CHEST

The following report is an over-read performed by radiologist Dr.
Onika Akter Boroduki [REDACTED] on 06/02/2021. This
over-read does not include interpretation of cardiac or coronary
anatomy or pathology. The coronary calcium score interpretation by
the cardiologist is attached.
FINDINGS: Within the visualized portions of the thorax there are no suspicious
appearing pulmonary nodules or masses, there is no acute
consolidative airspace disease, no pleural effusions, no
pneumothorax and no lymphadenopathy. Visualized portions of the
upper abdomen are unremarkable. There are no aggressive appearing
lytic or blastic lesions noted in the visualized portions of the
skeleton.
IMPRESSION: 1. No significant incidental noncardiac findings are noted.

## 2023-02-23 ENCOUNTER — Other Ambulatory Visit: Payer: Self-pay | Admitting: Internal Medicine

## 2023-02-23 DIAGNOSIS — E538 Deficiency of other specified B group vitamins: Secondary | ICD-10-CM

## 2023-04-02 DIAGNOSIS — H524 Presbyopia: Secondary | ICD-10-CM | POA: Diagnosis not present

## 2023-06-18 ENCOUNTER — Encounter: Payer: Self-pay | Admitting: Internal Medicine

## 2023-06-18 ENCOUNTER — Ambulatory Visit: Payer: Medicare Other | Admitting: Internal Medicine

## 2023-06-18 VITALS — BP 132/78 | HR 53 | Temp 97.9°F | Resp 16 | Ht 69.0 in | Wt 166.0 lb

## 2023-06-18 DIAGNOSIS — D510 Vitamin B12 deficiency anemia due to intrinsic factor deficiency: Secondary | ICD-10-CM | POA: Diagnosis not present

## 2023-06-18 DIAGNOSIS — Z0000 Encounter for general adult medical examination without abnormal findings: Secondary | ICD-10-CM

## 2023-06-18 DIAGNOSIS — Z0001 Encounter for general adult medical examination with abnormal findings: Secondary | ICD-10-CM

## 2023-06-18 DIAGNOSIS — N1831 Chronic kidney disease, stage 3a: Secondary | ICD-10-CM

## 2023-06-18 DIAGNOSIS — E785 Hyperlipidemia, unspecified: Secondary | ICD-10-CM

## 2023-06-18 DIAGNOSIS — R001 Bradycardia, unspecified: Secondary | ICD-10-CM | POA: Diagnosis not present

## 2023-06-18 DIAGNOSIS — N400 Enlarged prostate without lower urinary tract symptoms: Secondary | ICD-10-CM | POA: Diagnosis not present

## 2023-06-18 DIAGNOSIS — I10 Essential (primary) hypertension: Secondary | ICD-10-CM | POA: Diagnosis not present

## 2023-06-18 DIAGNOSIS — Z1211 Encounter for screening for malignant neoplasm of colon: Secondary | ICD-10-CM

## 2023-06-18 LAB — CBC WITH DIFFERENTIAL/PLATELET
Basophils Absolute: 0 10*3/uL (ref 0.0–0.1)
Basophils Relative: 0.5 % (ref 0.0–3.0)
Eosinophils Absolute: 0.1 10*3/uL (ref 0.0–0.7)
Eosinophils Relative: 1.5 % (ref 0.0–5.0)
HCT: 44.4 % (ref 39.0–52.0)
Hemoglobin: 15 g/dL (ref 13.0–17.0)
Lymphocytes Relative: 32.2 % (ref 12.0–46.0)
Lymphs Abs: 1.6 10*3/uL (ref 0.7–4.0)
MCHC: 33.9 g/dL (ref 30.0–36.0)
MCV: 93 fl (ref 78.0–100.0)
Monocytes Absolute: 0.5 10*3/uL (ref 0.1–1.0)
Monocytes Relative: 9 % (ref 3.0–12.0)
Neutro Abs: 2.9 10*3/uL (ref 1.4–7.7)
Neutrophils Relative %: 56.8 % (ref 43.0–77.0)
Platelets: 281 10*3/uL (ref 150.0–400.0)
RBC: 4.77 Mil/uL (ref 4.22–5.81)
RDW: 13.3 % (ref 11.5–15.5)
WBC: 5.1 10*3/uL (ref 4.0–10.5)

## 2023-06-18 LAB — BASIC METABOLIC PANEL WITH GFR
BUN: 13 mg/dL (ref 6–23)
CO2: 28 meq/L (ref 19–32)
Calcium: 9.4 mg/dL (ref 8.4–10.5)
Chloride: 106 meq/L (ref 96–112)
Creatinine, Ser: 1.24 mg/dL (ref 0.40–1.50)
GFR: 60.07 mL/min (ref >60.00)
Glucose, Bld: 76 mg/dL (ref 70–99)
Potassium: 4.1 meq/L (ref 3.5–5.1)
Sodium: 142 meq/L (ref 135–145)

## 2023-06-18 LAB — TSH: TSH: 5.26 u[IU]/mL (ref 0.35–5.50)

## 2023-06-18 LAB — URINALYSIS, ROUTINE W REFLEX MICROSCOPIC
Bilirubin Urine: NEGATIVE
Hgb urine dipstick: NEGATIVE
Ketones, ur: NEGATIVE
Leukocytes,Ua: NEGATIVE
Nitrite: NEGATIVE
Specific Gravity, Urine: 1.02 (ref 1.000–1.030)
Total Protein, Urine: NEGATIVE
Urine Glucose: NEGATIVE
Urobilinogen, UA: 0.2 (ref 0.0–1.0)
pH: 6 (ref 5.0–8.0)

## 2023-06-18 LAB — HEPATIC FUNCTION PANEL
ALT: 9 U/L (ref 0–53)
AST: 10 U/L (ref 0–37)
Albumin: 4.3 g/dL (ref 3.5–5.2)
Alkaline Phosphatase: 53 U/L (ref 39–117)
Bilirubin, Direct: 0.1 mg/dL (ref 0.0–0.3)
Total Bilirubin: 0.9 mg/dL (ref 0.2–1.2)
Total Protein: 6.9 g/dL (ref 6.0–8.3)

## 2023-06-18 LAB — PSA: PSA: 1.02 ng/mL (ref 0.10–4.00)

## 2023-06-18 MED ORDER — ROSUVASTATIN CALCIUM 20 MG PO TABS: 20.0000 mg | ORAL_TABLET | Freq: Every day | ORAL | 1 refills | Status: DC

## 2023-06-18 MED ORDER — CYANOCOBALAMIN 1000 MCG/ML IJ SOLN
1000.0000 ug | Freq: Once | INTRAMUSCULAR | Status: AC
  Administered 2023-06-18: 1000 ug via INTRAMUSCULAR

## 2023-06-18 NOTE — Patient Instructions (Signed)
 Health Maintenance, Male  Adopting a healthy lifestyle and getting preventive care are important in promoting health and wellness. Ask your health care provider about:  The right schedule for you to have regular tests and exams.  Things you can do on your own to prevent diseases and keep yourself healthy.  What should I know about diet, weight, and exercise?  Eat a healthy diet    Eat a diet that includes plenty of vegetables, fruits, low-fat dairy products, and lean protein.  Do not eat a lot of foods that are high in solid fats, added sugars, or sodium.  Maintain a healthy weight  Body mass index (BMI) is a measurement that can be used to identify possible weight problems. It estimates body fat based on height and weight. Your health care provider can help determine your BMI and help you achieve or maintain a healthy weight.  Get regular exercise  Get regular exercise. This is one of the most important things you can do for your health. Most adults should:  Exercise for at least 150 minutes each week. The exercise should increase your heart rate and make you sweat (moderate-intensity exercise).  Do strengthening exercises at least twice a week. This is in addition to the moderate-intensity exercise.  Spend less time sitting. Even light physical activity can be beneficial.  Watch cholesterol and blood lipids  Have your blood tested for lipids and cholesterol at 68 years of age, then have this test every 5 years.  You may need to have your cholesterol levels checked more often if:  Your lipid or cholesterol levels are high.  You are older than 68 years of age.  You are at high risk for heart disease.  What should I know about cancer screening?  Many types of cancers can be detected early and may often be prevented. Depending on your health history and family history, you may need to have cancer screening at various ages. This may include screening for:  Colorectal cancer.  Prostate cancer.  Skin cancer.  Lung  cancer.  What should I know about heart disease, diabetes, and high blood pressure?  Blood pressure and heart disease  High blood pressure causes heart disease and increases the risk of stroke. This is more likely to develop in people who have high blood pressure readings or are overweight.  Talk with your health care provider about your target blood pressure readings.  Have your blood pressure checked:  Every 3-5 years if you are 68-95 years of age.  Every year if you are 68 years old or older.  If you are between the ages of 29 and 29 and are a current or former smoker, ask your health care provider if you should have a one-time screening for abdominal aortic aneurysm (AAA).  Diabetes  Have regular diabetes screenings. This checks your fasting blood sugar level. Have the screening done:  Once every three years after age 68 if you are at a normal weight and have a low risk for diabetes.  More often and at a younger age if you are overweight or have a high risk for diabetes.  What should I know about preventing infection?  Hepatitis B  If you have a higher risk for hepatitis B, you should be screened for this virus. Talk with your health care provider to find out if you are at risk for hepatitis B infection.  Hepatitis C  Blood testing is recommended for:  Everyone born from [birth year] through 1965.  Anyone  with known risk factors for hepatitis C.  Sexually transmitted infections (STIs)  You should be screened each year for STIs, including gonorrhea and chlamydia, if:  You are sexually active and are younger than 68 years of age.  You are older than 68 years of age and your health care provider tells you that you are at risk for this type of infection.  Your sexual activity has changed since you were last screened, and you are at increased risk for chlamydia or gonorrhea. Ask your health care provider if you are at risk.  Ask your health care provider about whether you are at high risk for HIV. Your health care provider  may recommend a prescription medicine to help prevent HIV infection. If you choose to take medicine to prevent HIV, you should first get tested for HIV. You should then be tested every 3 months for as long as you are taking the medicine.  Follow these instructions at home:  Alcohol use  Do not drink alcohol if your health care provider tells you not to drink.  If you drink alcohol:  Limit how much you have to 0-2 drinks a day.  Know how much alcohol is in your drink. In the [place] one drink equals one 12 oz bottle of beer (355 mL), one 5 oz glass of wine (148 mL), or one 1 oz glass of hard liquor (44 mL).  Lifestyle  Do not use any products that contain nicotine or tobacco. These products include cigarettes, chewing tobacco, and vaping devices, such as e-cigarettes. If you need help quitting, ask your health care provider.  Do not use street drugs.  Do not share needles.  Ask your health care provider for help if you need support or information about quitting drugs.  General instructions  Schedule regular health, dental, and eye exams.  Stay current with your vaccines.  Tell your health care provider if:  You often feel depressed.  You have ever been abused or do not feel safe at home.  Summary  Adopting a healthy lifestyle and getting preventive care are important in promoting health and wellness.  Follow your health care provider's instructions about healthy diet, exercising, and getting tested or screened for diseases.  Follow your health care provider's instructions on monitoring your cholesterol and blood pressure.  This information is not intended to replace advice given to you by your health care provider. Make sure you discuss any questions you have with your health care provider.  Document Revised: 06/20/2020 Document Reviewed: 06/20/2020  Elsevier Patient Education  2024 [employer].

## 2023-06-18 NOTE — Progress Notes (Unsigned)
 Subjective:  Patient ID: Jeremiah Mitchell, male    DOB: Jul 29, 1955  Age: 68 y.o. MRN: [id number]  CC: Annual Exam, Hypertension, and Hyperlipidemia   HPI Jeremiah Mitchell presents for a CPX and f/up ---  Discussed the use of AI scribe software for clinical note transcription with the patient, who gave verbal consent to proceed.  History of Present Illness   Jeremiah Mitchell is a 68 year old male who presents for a head to toe checkup.  He feels fine overall with no symptoms related to his low heart rate, which he measured at 52 bpm last night. No dizziness, lightheadedness, or near syncope. He maintains an active lifestyle, working every day, and does not report any cardiovascular symptoms during physical activity. He recalls experiencing shortness of breath once during heavy lifting, but not recently.  He mentions taking his blood pressure last night, which was approximately 144 mmHg. No associated symptoms such as headache, blurred vision, chest pain, or shortness of breath. He is not currently taking cholesterol medication, as he has not refilled his statin prescription. He is only taking one blood pressure medication at this time.       Outpatient Medications Prior to Visit  Medication Sig Dispense Refill   olmesartan  (BENICAR ) 20 MG tablet Take 1 tablet (20 mg total) by mouth daily. 90 tablet 1   rosuvastatin  (CRESTOR ) 20 MG tablet Take 1 tablet (20 mg total) by mouth daily. (Patient not taking: Reported on 06/18/2023) 90 tablet 1   No facility-administered medications prior to visit.    ROS Review of Systems  Objective:  BP 132/78 (BP Location: Left Arm, Patient Position: Sitting, Cuff Size: Normal)   Pulse (!) 53   Temp 97.9 F (36.6 C) (Oral)   Resp 16   Ht 5\' 9"  (1.753 m)   Wt 166 lb (75.3 kg)   SpO2 97%   BMI 24.51 kg/m   BP Readings from Last 3 Encounters:  06/18/23 132/78  12/19/22 (!) 144/82  09/05/22 118/64    Wt Readings from Last 3 Encounters:   06/18/23 166 lb (75.3 kg)  12/19/22 162 lb 3.2 oz (73.6 kg)  10/03/22 154 lb (69.9 kg)    Physical Exam Cardiovascular:     Rate and Rhythm: Regular rhythm. Bradycardia present.     Comments: EKG-- SB, 50 bpm No LVH, Q waves, or ST/T wave changes  unchanged Abdominal:     Hernia: There is no hernia in the left inguinal area or right inguinal area.  Genitourinary:    Pubic Area: No rash.      Penis: Normal and circumcised.      Testes: Normal.     Epididymis:     Right: Normal.     Left: Normal.     Prostate: Enlarged. Not tender and no nodules present.     Rectum: Normal. Guaiac result negative. No mass, tenderness, anal fissure, external hemorrhoid or internal hemorrhoid. Normal anal tone.  Musculoskeletal:     Right lower leg: No edema.     Left lower leg: No edema.  Lymphadenopathy:     Lower Body: No right inguinal adenopathy. No left inguinal adenopathy.     Lab Results  Component Value Date   WBC 5.1 06/18/2023   HGB 15.0 06/18/2023   HCT 44.4 06/18/2023   PLT 281.0 06/18/2023   GLUCOSE 76 06/18/2023   CHOL 201 (H) 06/18/2022   TRIG 134.0 06/18/2022   HDL 39.50 06/18/2022   LDLCALC 135 (H) 06/18/2022  ALT 9 06/18/2023   AST 10 06/18/2023   NA 142 06/18/2023   K 4.1 06/18/2023   CL 106 06/18/2023   CREATININE 1.24 06/18/2023   BUN 13 06/18/2023   CO2 28 06/18/2023   TSH 5.26 06/18/2023   PSA 1.02 06/18/2023   INR 1.0 07/04/2022    CT HEAD WO CONTRAST Result Date: 07/04/2022 CLINICAL DATA:  Headache EXAM: CT HEAD WITHOUT CONTRAST TECHNIQUE: Contiguous axial images were obtained from the base of the skull through the vertex without intravenous contrast. RADIATION DOSE REDUCTION: This exam was performed according to the departmental dose-optimization program which includes automated exposure control, adjustment of the mA and/or kV according to patient size and/or use of iterative reconstruction technique. COMPARISON:  None Available. FINDINGS: Brain: No  evidence of acute infarction, hemorrhage, hydrocephalus, extra-axial collection or mass lesion/mass effect. Vascular: No hyperdense vessel or unexpected calcification. Skull: Normal. Negative for fracture or focal lesion. Sinuses/Orbits: Mucosal thickening of the bilateral maxillary sinuses and ethmoid air cells, with small maxillary air-fluid levels. Other: None. IMPRESSION: 1. No acute intracranial pathology. 2. Mucosal thickening of the bilateral maxillary sinuses and ethmoid air cells, with small maxillary air-fluid levels. Correlate for acute sinusitis. Electronically Signed   By: Fredricka Jenny M.D.   On: 07/04/2022 15:47    Assessment & Plan:  Stage 3a chronic kidney disease (HCC) -     Basic metabolic panel with GFR; Future -     Urinalysis, Routine w reflex microscopic; Future  Encounter for general adult medical examination with abnormal findings  Benign prostatic hyperplasia without lower urinary tract symptoms -     PSA; Future  Primary hypertension -     Basic metabolic panel with GFR; Future -     TSH; Future -     Urinalysis, Routine w reflex microscopic; Future -     EKG 12-Lead  Hyperlipidemia LDL goal <130 -     Rosuvastatin  Calcium ; Take 1 tablet (20 mg total) by mouth daily.  Dispense: 90 tablet; Refill: 1 -     TSH; Future -     Hepatic function panel; Future  Vitamin B12 deficiency anemia due to intrinsic factor deficiency -     CBC with Differential/Platelet; Future -     Cyanocobalamin   Bradycardia -     TSH; Future -     EKG 12-Lead  Screening for colon cancer -     Ambulatory referral to Gastroenterology     Follow-up: Return in about 6 months (around 12/19/2023).  Sandra Crouch, MD

## 2023-07-16 ENCOUNTER — Other Ambulatory Visit: Payer: Self-pay | Admitting: Internal Medicine

## 2023-07-16 DIAGNOSIS — I10 Essential (primary) hypertension: Secondary | ICD-10-CM

## 2023-10-07 ENCOUNTER — Ambulatory Visit (INDEPENDENT_AMBULATORY_CARE_PROVIDER_SITE_OTHER): Payer: Medicare Other

## 2023-10-07 ENCOUNTER — Other Ambulatory Visit: Payer: Self-pay | Admitting: Internal Medicine

## 2023-10-07 VITALS — Ht 69.0 in | Wt 166.0 lb

## 2023-10-07 DIAGNOSIS — Z0000 Encounter for general adult medical examination without abnormal findings: Secondary | ICD-10-CM | POA: Diagnosis not present

## 2023-10-07 DIAGNOSIS — Z1211 Encounter for screening for malignant neoplasm of colon: Secondary | ICD-10-CM

## 2023-10-07 NOTE — Patient Instructions (Signed)
 Mr. Jeremiah Mitchell , Thank you for taking time out of your busy schedule to complete your Annual Wellness Visit with me. I enjoyed our conversation and look forward to speaking with you again next year. I, as well as your care team,  appreciate your ongoing commitment to your health goals. Please review the following plan we discussed and let me know if I can assist you in the future. Your Game plan/ To Do List    Follow up Visits: We will see or speak with you next year for your Next Medicare AWV with our clinical staff Have you seen your provider in the last 6 months (3 months if uncontrolled diabetes)? Yes  Clinician Recommendations:  Aim for 30 minutes of exercise or brisk walking, 6-8 glasses of water, and 5 servings of fruits and vegetables each day. Remember to discuss a Cologuard or and a colonoscopy with Dr. Joshua during your next up coming office visit.      This is a list of the screenings recommended for you:  Health Maintenance  Topic Date Due   Colon Cancer Screening  Never done   COVID-19 Vaccine (3 - 2024-25 season) 10/14/2022   Medicare Annual Wellness Visit  10/03/2023   Flu Shot  09/13/2023   DTaP/Tdap/Td vaccine (2 - Td or Tdap) 06/17/2032   Pneumococcal Vaccine for age over 109  Completed   Hepatitis C Screening  Completed   Zoster (Shingles) Vaccine  Completed   HPV Vaccine  Aged Out   Meningitis B Vaccine  Aged Out    Advanced directives: (Copy Requested) Please bring a copy of your health care power of attorney and living will to the office to be added to your chart at your convenience. You can mail to [facility] [address] [place], [place] [address] or email to [email or web address] Advance Care Planning is important because it:  [x]  Makes sure you receive the medical care that is consistent with your values, goals, and preferences  [x]  It provides guidance to your family and loved ones and reduces their decisional burden about whether or  not they are making the right decisions based on your wishes.  Follow the link provided in your after visit summary or read over the paperwork we have mailed to you to help you started getting your Advance Directives in place. If you need assistance in completing these, please reach out to us  so that we can help you!  See attachments for Preventive Care and Fall Prevention Tips.

## 2023-10-07 NOTE — Progress Notes (Signed)
 Subjective:   Jeremiah Mitchell is a 68 y.o. who presents for a Medicare Wellness preventive visit.  As a reminder, Annual Wellness Visits don't include a physical exam, and some assessments may be limited, especially if this visit is performed virtually. We may recommend an in-person follow-up visit with your provider if needed.  Visit Complete: Virtual I connected with  Jeremiah Mitchell on 10/07/23 by a audio enabled telemedicine application and verified that I am speaking with the correct person using two identifiers.  Patient Location: Home  Provider Location: Home Office  I discussed the limitations of evaluation and management by telemedicine. The patient expressed understanding and agreed to proceed.  Vital Signs: Because this visit was a virtual/telehealth visit, some criteria may be missing or patient reported. Any vitals not documented were not able to be obtained and vitals that have been documented are patient reported.  VideoDeclined- This patient declined Librarian, academic. Therefore the visit was completed with audio only.  Persons Participating in Visit: Patient.  AWV Questionnaire: Yes: Patient Medicare AWV questionnaire was completed by the patient on 10/03/2023; I have confirmed that all information answered by patient is correct and no changes since this date.  Cardiac Risk Factors include: advanced age (>5men, >38 women);hypertension;male gender;dyslipidemia;Other (see comment), Risk factor comments: CKD stage 3a, BPH     Objective:    Today's Vitals   10/07/23 0815  Weight: 166 lb (75.3 kg)  Height: 5' 9 (1.753 m)   Body mass index is 24.51 kg/m.     10/07/2023    8:19 AM 10/03/2022    8:49 AM 07/04/2022    1:56 PM 09/13/2021    9:00 AM 11/15/2016    1:47 PM 11/27/2015    9:34 AM 07/21/2011   10:30 AM  Advanced Directives  Does Patient Have a Medical Advance Directive? -- Yes No Yes No  No  Patient does not have advance  directive   Type of Advance Directive  Healthcare Power of [place];Living will  Living will     Does patient want to make changes to medical advance directive?    No - Patient declined     Copy of Healthcare Power of Attorney in Chart?  No - copy requested       Would patient like information on creating a medical advance directive?      No - patient declined information       Data saved with a previous flowsheet row definition    Current Medications (verified) Outpatient Encounter Medications as of 10/07/2023  Medication Sig   olmesartan  (BENICAR ) 20 MG tablet TAKE 1 TABLET BY MOUTH EVERY DAY   rosuvastatin  (CRESTOR ) 20 MG tablet Take 1 tablet (20 mg total) by mouth daily.   No facility-administered encounter medications on file as of 10/07/2023.    Allergies (verified) Penicillins   History: Past Medical History:  Diagnosis Date   Allergy    Depression    Hypertension    Past Surgical History:  Procedure Laterality Date   APPENDECTOMY     Family History  Problem Relation Age of Onset   Parkinson's disease Mother    Colon cancer Mother    Breast cancer Mother    Heart attack Father    Colon cancer Maternal Aunt    Colon cancer Maternal Uncle    Social History   Socioeconomic History   Marital status: Married    Spouse name: Jeremiah Mitchell   Number of children: Not on file  Years of education: Not on file   Highest education level: 12th grade  Occupational History   Occupation: Tow truck driver  Tobacco Use   Smoking status: Never   Smokeless tobacco: Never  Vaping Use   Vaping status: Never Used  Substance and Sexual Activity   Alcohol use: No   Drug use: No   Sexual activity: Yes  Other Topics Concern   Not on file  Social History Narrative   Lives with wife/2025   Social Drivers of Health   Financial Resource Strain: Low Risk  (10/03/2023)   Overall Financial Resource Strain (CARDIA)    Difficulty of Paying Living Expenses: Not hard at all  Food  Insecurity: No Food Insecurity (10/03/2023)   Hunger Vital Sign    Worried About Running Out of Food in the Last Year: Never true    Ran Out of Food in the Last Year: Never true  Transportation Needs: No Transportation Needs (10/03/2023)   PRAPARE - Administrator, Civil Service (Medical): No    Lack of Transportation (Non-Medical): No  Physical Activity: Insufficiently Active (10/03/2023)   Exercise Vital Sign    Days of Exercise per Week: 3 days    Minutes of Exercise per Session: 20 min  Stress: No Stress Concern Present (10/03/2023)   [employer] of Occupational Health - Occupational Stress Questionnaire    Feeling of Stress: Only a little  Social Connections: Moderately Isolated (10/03/2023)   Social Connection and Isolation Panel    Frequency of Communication with Friends and Family: More than three times a week    Frequency of Social Gatherings with Friends and Family: Twice a week    Attends Religious Services: Never    Database administrator or Organizations: No    Attends Engineer, structural: Not on file    Marital Status: Married    Tobacco Counseling Counseling given: Not Answered    Clinical Intake:  Pre-visit preparation completed: Yes  Pain : No/denies pain     BMI - recorded: 24.51 Nutritional Status: BMI of 19-24  Normal Nutritional Risks: None Diabetes: No  No results found for: HGBA1C   How often do you need to have someone help you when you read instructions, pamphlets, or other written materials from your doctor or pharmacy?: 1 - Never  Interpreter Needed?: No  Information entered by :: Revan Gendron, RMA   Activities of Daily Living     10/03/2023   10:31 PM  In your present state of health, do you have any difficulty performing the following activities:  Hearing? 0  Vision? 0  Difficulty concentrating or making decisions? 0  Walking or climbing stairs? 0  Dressing or bathing? 0  Doing errands, shopping? 0   Preparing Food and eating ? N  Using the Toilet? N  In the past six months, have you accidently leaked urine? N  Do you have problems with loss of bowel control? N  Managing your Medications? N  Managing your Finances? N  Housekeeping or managing your Housekeeping? N    Patient Care Team: Joshua Debby CROME, MD as PCP - General (Internal Medicine) [facility] as Consulting Physician (Optometry)  I have updated your Care Teams any recent Medical Services you may have received from other providers in the past year.     Assessment:   This is a routine wellness examination for [place].  Hearing/Vision screen Hearing Screening - Comments:: Denies hearing difficulties   Vision Screening -  Comments:: Denies vision issues./[facility]    Goals Addressed             This Visit's Progress    My goal is to stay healthy and active.   On track      Depression Screen     10/07/2023    8:21 AM 06/18/2023    7:59 AM 10/03/2022    8:53 AM 03/26/2022    8:00 AM 09/13/2021    8:59 AM 01/03/2021    8:06 AM 12/12/2016    8:50 AM  PHQ 2/9 Scores  PHQ - 2 Score 0 0 0 0 0 0 0  PHQ- 9 Score 0 0 0 0       Fall Risk     10/03/2023   10:31 PM 06/18/2023    7:58 AM 10/03/2022    8:51 AM 03/26/2022    8:00 AM 09/13/2021    9:04 AM  Fall Risk   Falls in the past year? 1 0 0 0 0  Number falls in past yr:  0 0 0 0  Injury with Fall? 0 0 0 0 0  Risk for fall due to :  No Fall Risks No Fall Risks No Fall Risks No Fall Risks  Follow up  Falls evaluation completed Falls prevention discussed Falls evaluation completed Falls evaluation completed      Data saved with a previous flowsheet row definition    MEDICARE RISK AT HOME:  Medicare Risk at Home Any stairs in or around the home?: (Patient-Rptd) Yes If so, are there any without handrails?: (Patient-Rptd) No Home free of loose throw rugs in walkways, pet beds, electrical cords, etc?: (Patient-Rptd) Yes Adequate lighting in  your home to reduce risk of falls?: (Patient-Rptd) Yes Life alert?: (Patient-Rptd) No Use of a cane, walker or w/c?: (Patient-Rptd) No Grab bars in the bathroom?: (Patient-Rptd) No Shower chair or bench in shower?: (Patient-Rptd) Yes Elevated toilet seat or a handicapped toilet?: (Patient-Rptd) No  TIMED UP AND GO:  Was the test performed?  No  Cognitive Function: Declined/Normal: No cognitive concerns noted by patient or family. Patient alert, oriented, able to answer questions appropriately and recall recent events. No signs of memory loss or confusion.        10/03/2022    8:52 AM 09/13/2021    9:05 AM  6CIT Screen  What Year? 0 points 0 points  What month? 0 points 0 points  What time? 0 points 0 points  Count back from 20 0 points 0 points  Months in reverse 0 points 0 points  Repeat phrase 0 points 0 points  Total Score 0 points 0 points    Immunizations Immunization History  Administered Date(s) Administered   Fluad Quad(high Dose 65+) 01/03/2021   Fluad Trivalent(High Dose 65+) 12/19/2022   Influenza, High Dose Seasonal PF 11/01/2021   Moderna Sars-Covid-2 Vaccination 04/17/2019, 05/13/2019   PNEUMOCOCCAL CONJUGATE-20 01/03/2021   Tdap 06/18/2022   Zoster Recombinant(Shingrix ) 04/06/2021, 09/24/2021    Screening Tests Health Maintenance  Topic Date Due   Colonoscopy  Never done   COVID-19 Vaccine (3 - 2024-25 season) 10/14/2022   Medicare Annual Wellness (AWV)  10/03/2023   INFLUENZA VACCINE  09/13/2023   DTaP/Tdap/Td (2 - Td or Tdap) 06/17/2032   Pneumococcal Vaccine: 50+ Years  Completed   Hepatitis C Screening  Completed   Zoster Vaccines- Shingrix   Completed   HPV VACCINES  Aged Out   Meningococcal B Vaccine  Aged Out    Health Maintenance  Health Maintenance Due  Topic Date Due   Colonoscopy  Never done   COVID-19 Vaccine (3 - 2024-25 season) 10/14/2022   Medicare Annual Wellness (AWV)  10/03/2023   INFLUENZA VACCINE  09/13/2023   Health  Maintenance Items Addressed: See Nurse Notes at the end of this note  Additional Screening:  Vision Screening: Recommended annual ophthalmology exams for early detection of glaucoma and other disorders of the eye. Would you like a referral to an eye doctor? No    Dental Screening: Recommended annual dental exams for proper oral hygiene  Community Resource Referral / Chronic Care Management: CRR required this visit?  No   CCM required this visit?  No   Plan:    I have personally reviewed and noted the following in the patient's chart:   Medical and social history Use of alcohol, tobacco or illicit drugs  Current medications and supplements including opioid prescriptions. Patient is not currently taking opioid prescriptions. Functional ability and status Nutritional status Physical activity Advanced directives List of other physicians Hospitalizations, surgeries, and ER visits in previous 12 months Vitals Screenings to include cognitive, depression, and falls Referrals and appointments  In addition, I have reviewed and discussed with patient certain preventive protocols, quality metrics, and best practice recommendations. A written personalized care plan for preventive services as well as general preventive health recommendations were provided to patient.   Matteus Mcnelly L Raymel Cull, CMA   10/07/2023   After Visit Summary: (MyChart) Due to this being a telephonic visit, the after visit summary with patients personalized plan was offered to patient via MyChart   Notes: Patient is due for a colonoscopy, however, he would like to discuss getting a Cologuard, with Dr. Joshua during his next office visit.  He had no other concerns to address today.

## 2023-10-16 DIAGNOSIS — K080 Exfoliation of teeth due to systemic causes: Secondary | ICD-10-CM | POA: Diagnosis not present

## 2023-12-18 LAB — COLOGUARD: COLOGUARD: NEGATIVE

## 2023-12-19 ENCOUNTER — Ambulatory Visit: Admitting: Internal Medicine

## 2023-12-19 ENCOUNTER — Encounter: Payer: Self-pay | Admitting: Internal Medicine

## 2023-12-19 ENCOUNTER — Ambulatory Visit: Payer: Self-pay | Admitting: Internal Medicine

## 2023-12-19 VITALS — BP 156/86 | HR 61 | Temp 98.4°F | Ht 69.0 in | Wt 169.4 lb

## 2023-12-19 DIAGNOSIS — I10 Essential (primary) hypertension: Secondary | ICD-10-CM

## 2023-12-19 DIAGNOSIS — E785 Hyperlipidemia, unspecified: Secondary | ICD-10-CM

## 2023-12-19 DIAGNOSIS — Z23 Encounter for immunization: Secondary | ICD-10-CM

## 2023-12-19 DIAGNOSIS — D510 Vitamin B12 deficiency anemia due to intrinsic factor deficiency: Secondary | ICD-10-CM | POA: Diagnosis not present

## 2023-12-19 DIAGNOSIS — E538 Deficiency of other specified B group vitamins: Secondary | ICD-10-CM

## 2023-12-19 LAB — BASIC METABOLIC PANEL WITH GFR
BUN: 18 mg/dL (ref 6–23)
CO2: 27 meq/L (ref 19–32)
Calcium: 9.2 mg/dL (ref 8.4–10.5)
Chloride: 105 meq/L (ref 96–112)
Creatinine, Ser: 1.27 mg/dL (ref 0.40–1.50)
GFR: 58.17 mL/min — ABNORMAL LOW (ref >60.00)
Glucose, Bld: 94 mg/dL (ref 70–99)
Potassium: 4 meq/L (ref 3.5–5.1)
Sodium: 139 meq/L (ref 135–145)

## 2023-12-19 LAB — VITAMIN B12: Vitamin B-12: 183 pg/mL — ABNORMAL LOW (ref 211–911)

## 2023-12-19 LAB — CBC WITH DIFFERENTIAL/PLATELET
Basophils Absolute: 0 K/uL (ref 0.0–0.1)
Basophils Relative: 0.6 % (ref 0.0–3.0)
Eosinophils Absolute: 0.1 K/uL (ref 0.0–0.7)
Eosinophils Relative: 1.7 % (ref 0.0–5.0)
HCT: 41.7 % (ref 39.0–52.0)
Hemoglobin: 14.4 g/dL (ref 13.0–17.0)
Lymphocytes Relative: 25.7 % (ref 12.0–46.0)
Lymphs Abs: 1.4 K/uL (ref 0.7–4.0)
MCHC: 34.6 g/dL (ref 30.0–36.0)
MCV: 92.3 fl (ref 78.0–100.0)
Monocytes Absolute: 0.4 K/uL (ref 0.1–1.0)
Monocytes Relative: 7.4 % (ref 3.0–12.0)
Neutro Abs: 3.6 K/uL (ref 1.4–7.7)
Neutrophils Relative %: 64.6 % (ref 43.0–77.0)
Platelets: 269 K/uL (ref 150.0–400.0)
RBC: 4.52 Mil/uL (ref 4.22–5.81)
RDW: 13.3 % (ref 11.5–15.5)
WBC: 5.6 K/uL (ref 4.0–10.5)

## 2023-12-19 LAB — LIPID PANEL
Cholesterol: 221 mg/dL — ABNORMAL HIGH (ref 0–200)
HDL: 46.8 mg/dL (ref >39.00)
LDL Cholesterol: 155 mg/dL — ABNORMAL HIGH (ref 0–99)
NonHDL: 174.66
Total CHOL/HDL Ratio: 5
Triglycerides: 99 mg/dL (ref 0.0–149.0)
VLDL: 19.8 mg/dL (ref 0.0–40.0)

## 2023-12-19 LAB — FOLATE: Folate: 5.3 ng/mL — ABNORMAL LOW (ref >5.9)

## 2023-12-19 MED ORDER — INDAPAMIDE 1.25 MG PO TABS: 1.2500 mg | ORAL_TABLET | Freq: Every day | ORAL | 0 refills | Status: DC

## 2023-12-19 MED ORDER — CYANOCOBALAMIN 2000 MCG PO TABS: 2000.0000 ug | ORAL_TABLET | Freq: Every day | ORAL | 0 refills | Status: AC

## 2023-12-19 MED ORDER — FOLIC ACID 1 MG PO TABS: 1.0000 mg | ORAL_TABLET | Freq: Every day | ORAL | 0 refills | Status: DC

## 2023-12-19 MED ORDER — ROSUVASTATIN CALCIUM 20 MG PO TABS: 20.0000 mg | ORAL_TABLET | Freq: Every day | ORAL | 0 refills | Status: AC

## 2023-12-19 MED ORDER — OLMESARTAN MEDOXOMIL 20 MG PO TABS: 20.0000 mg | ORAL_TABLET | Freq: Every day | ORAL | 0 refills | Status: DC

## 2023-12-19 NOTE — Progress Notes (Signed)
 "  Subjective:  Patient ID: Jeremiah Mitchell, male    DOB: 1955-07-16  Age: 68 y.o. MRN: [id number]  CC: Hypertension and Hyperlipidemia   HPI Jeremiah Mitchell presents for f/up ----  Discussed the use of AI scribe software for clinical note transcription with the patient, who gave verbal consent to proceed.  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 Afternoon readings have been between 146/84 and 150/86, exceeding the 140/90 threshold. He recalls previously being on two blood pressure medications but is currently taking only one. He takes this medication daily at 7 AM without side effects.  No symptoms such as headaches, blurred vision, chest pain, shortness of breath, dizziness, or lightheadedness. 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 He is generally active throughout the day and feels good during physical activity.  He recently completed a Cologuard test and sent it off about a week and a half ago.  Outpatient Medications Prior to Visit  Medication Sig Dispense Refill   olmesartan  (BENICAR ) 20 MG tablet TAKE 1 TABLET BY MOUTH EVERY DAY 90 tablet 1   rosuvastatin  (CRESTOR ) 20 MG tablet Take 1 tablet (20 mg total) by mouth daily. 90 tablet 1   No facility-administered medications prior to visit.    ROS Review of Systems  Constitutional:  Negative for appetite change, chills, diaphoresis, fatigue and fever.  HENT: Negative.    Eyes: Negative.   Respiratory: Negative.  Negative for cough, chest tightness, shortness of breath and wheezing.   Cardiovascular:  Negative for chest pain, palpitations and leg swelling.  Gastrointestinal: Negative.  Negative for abdominal pain, constipation, diarrhea, nausea and vomiting.  Genitourinary:   Negative for difficulty urinating and dysuria.  Musculoskeletal:  Negative for arthralgias and neck stiffness.  Skin: Negative.  Negative for color change and pallor.  Neurological:  Negative for dizziness and weakness.  Hematological:  Negative for adenopathy. Does not bruise/bleed easily.  Psychiatric/Behavioral: Negative.      Objective:  BP (!) 156/86 (BP Location: Left Arm, Patient Position: Sitting, Cuff Size: Normal)   Pulse 61   Temp 98.4 F (36.9 C) (Oral)   Ht 5' 9 (1.753 m)   Wt 169 lb 6.4 oz (76.8 kg)   SpO2 99%   BMI 25.02 kg/m   BP Readings from Last 3 Encounters:  12/19/23 (!) 156/86  06/18/23 132/78  12/19/22 (!) 144/82    Wt Readings from Last 3 Encounters:  12/19/23 169 lb 6.4 oz (76.8 kg)  10/07/23 166 lb (75.3 kg)  06/18/23 166 lb (75.3 kg)    Physical Exam Vitals reviewed.  Constitutional:      Appearance: Normal appearance.  HENT:     Nose: Nose normal.     Mouth/Throat:     Mouth: Mucous membranes are moist.  Eyes:     General: No scleral icterus.    Conjunctiva/sclera: Conjunctivae normal.  Cardiovascular:     Rate and Rhythm: Normal rate and regular rhythm.     Heart sounds: No murmur heard.    No friction rub. No gallop.  Pulmonary:     Effort: Pulmonary effort is normal.     Breath sounds: No stridor. No wheezing, rhonchi or rales.  Abdominal:     General: Abdomen is flat.  Palpations: There is no mass.     Tenderness: There is no abdominal tenderness. There is no guarding.     Hernia: No hernia is present.  Musculoskeletal:        General: Normal range of motion.     Cervical back: Neck supple.     Right lower leg: No edema.     Left lower leg: No edema.  Lymphadenopathy:     Cervical: No cervical adenopathy.  Skin:    General: Skin is warm.  Neurological:     General: No focal deficit present.     Mental Status: He is alert.  Psychiatric:        Mood and Affect: Mood normal.        Behavior: Behavior normal.      Lab Results  Component Value Date   WBC 5.6 12/19/2023   HGB 14.4 12/19/2023   HCT 41.7 12/19/2023   PLT 269.0 12/19/2023   GLUCOSE 94 12/19/2023   CHOL 221 (H) 12/19/2023   TRIG 99.0 12/19/2023   HDL 46.80 12/19/2023   LDLCALC 155 (H) 12/19/2023   ALT 9 06/18/2023   AST 10 06/18/2023   NA 139 12/19/2023   K 4.0 12/19/2023   CL 105 12/19/2023   CREATININE 1.27 12/19/2023   BUN 18 12/19/2023   CO2 27 12/19/2023   TSH 5.26 06/18/2023   PSA 1.02 06/18/2023   INR 1.0 07/04/2022    CT HEAD WO CONTRAST Result Date: 07/04/2022 CLINICAL DATA:  Headache EXAM: CT HEAD WITHOUT CONTRAST TECHNIQUE: Contiguous axial images were obtained from the base of the skull through the vertex without intravenous contrast. RADIATION DOSE REDUCTION: This exam was performed according to the departmental dose-optimization program which includes automated exposure control, adjustment of the mA and/or kV according to patient size and/or use of iterative reconstruction technique. COMPARISON:  None Available. FINDINGS: Brain: No evidence of acute infarction, hemorrhage, hydrocephalus, extra-axial collection or mass lesion/mass effect. Vascular: No hyperdense vessel or unexpected calcification. Skull: Normal. Negative for fracture or focal lesion. Sinuses/Orbits: Mucosal thickening of the bilateral maxillary sinuses and ethmoid air cells, with small maxillary air-fluid levels. Other: None. IMPRESSION: 1. No acute intracranial pathology. 2. Mucosal thickening of the bilateral maxillary sinuses and ethmoid air cells, with small maxillary air-fluid levels. Correlate for acute sinusitis. Electronically Signed   By: Marolyn JONETTA Jaksch M.D.   On: 07/04/2022 15:47    Assessment & Plan:   Primary hypertension- His BP is not at goal. Will add indapamide . -     Basic metabolic panel with GFR; Future -     Olmesartan  Medoxomil; Take 1 tablet (20 mg total) by mouth daily.  Dispense: 90 tablet; Refill: 0 -     Indapamide ; Take  1 tablet (1.25 mg total) by mouth daily.  Dispense: 90 tablet; Refill: 0 -     AMB Referral VBCI Care Management  Vitamin B12 deficiency anemia due to intrinsic factor deficiency -     CBC with Differential/Platelet; Future -     Vitamin B12; Future -     Folate; Future -     Methylmalonic acid, serum; Future -     Cyanocobalamin ; Take 1 tablet (2,000 mcg total) by mouth daily.  Dispense: 90 tablet; Refill: 0 -     AMB Referral VBCI Care Management  Dietary folate deficiency -     CBC with Differential/Platelet; Future -     Vitamin B12; Future -     Folate; Future -  Methylmalonic acid, serum; Future -     Folic Acid ; Take 1 tablet (1 mg total) by mouth daily.  Dispense: 90 tablet; Refill: 0 -     AMB Referral VBCI Care Management  Hyperlipidemia LDL goal <130- He has not achieved his LDL goal. Will restart the statin. -     Lipid panel; Future -     Rosuvastatin  Calcium ; Take 1 tablet (20 mg total) by mouth daily.  Dispense: 90 tablet; Refill: 0 -     AMB Referral VBCI Care Management  Need for immunization against influenza -     Flu vaccine HIGH DOSE PF(Fluzone Trivalent)     Follow-up: Return in about 6 months (around 06/17/2024).  Debby Molt, MD "

## 2023-12-19 NOTE — Patient Instructions (Signed)
 Hypertension, Adult High blood pressure (hypertension) is when the force of blood pumping through the arteries is too strong. The arteries are the blood vessels that carry blood from the heart throughout the body. Hypertension forces the heart to work harder to pump blood and may cause arteries to become narrow or stiff. Untreated or uncontrolled hypertension can lead to a heart attack, heart failure, a stroke, kidney disease, and other problems. A blood pressure reading consists of a higher number over a lower number. Ideally, your blood pressure should be below 120/80. The first ("top") number is called the systolic pressure. It is a measure of the pressure in your arteries as your heart beats. The second ("bottom") number is called the diastolic pressure. It is a measure of the pressure in your arteries as the heart relaxes. What are the causes? The exact cause of this condition is not known. There are some conditions that result in high blood pressure. What increases the risk? Certain factors may make you more likely to develop high blood pressure. Some of these risk factors are under your control, including: Smoking. Not getting enough exercise or physical activity. Being overweight. Having too much fat, sugar, calories, or salt (sodium) in your diet. Drinking too much alcohol. Other risk factors include: Having a personal history of heart disease, diabetes, high cholesterol, or kidney disease. Stress. Having a family history of high blood pressure and high cholesterol. Having obstructive sleep apnea. Age. The risk increases with age. What are the signs or symptoms? High blood pressure may not cause symptoms. Very high blood pressure (hypertensive crisis) may cause: Headache. Fast or irregular heartbeats (palpitations). Shortness of breath. Nosebleed. Nausea and vomiting. Vision changes. Severe chest pain, dizziness, and seizures. How is this diagnosed? This condition is diagnosed by  measuring your blood pressure while you are seated, with your arm resting on a flat surface, your legs uncrossed, and your feet flat on the floor. The cuff of the blood pressure monitor will be placed directly against the skin of your upper arm at the level of your heart. Blood pressure should be measured at least twice using the same arm. Certain conditions can cause a difference in blood pressure between your right and left arms. If you have a high blood pressure reading during one visit or you have normal blood pressure with other risk factors, you may be asked to: Return on a different day to have your blood pressure checked again. Monitor your blood pressure at home for 1 week or longer. If you are diagnosed with hypertension, you may have other blood or imaging tests to help your health care provider understand your overall risk for other conditions. How is this treated? This condition is treated by making healthy lifestyle changes, such as eating healthy foods, exercising more, and reducing your alcohol intake. You may be referred for counseling on a healthy diet and physical activity. Your health care provider may prescribe medicine if lifestyle changes are not enough to get your blood pressure under control and if: Your systolic blood pressure is above 130. Your diastolic blood pressure is above 80. Your personal target blood pressure may vary depending on your medical conditions, your age, and other factors. Follow these instructions at home: Eating and drinking  Eat a diet that is high in fiber and potassium, and low in sodium, added sugar, and fat. An example of this eating plan is called the DASH diet. DASH stands for Dietary Approaches to Stop Hypertension. To eat this way: Eat  plenty of fresh fruits and vegetables. Try to fill one half of your plate at each meal with fruits and vegetables. Eat whole grains, such as whole-wheat pasta, brown rice, or whole-grain bread. Fill about one  fourth of your plate with whole grains. Eat or drink low-fat dairy products, such as skim milk or low-fat yogurt. Avoid fatty cuts of meat, processed or cured meats, and poultry with skin. Fill about one fourth of your plate with lean proteins, such as fish, chicken without skin, beans, eggs, or tofu. Avoid pre-made and processed foods. These tend to be higher in sodium, added sugar, and fat. Reduce your daily sodium intake. Many people with hypertension should eat less than 1,500 mg of sodium a day. Do not drink alcohol if: Your health care provider tells you not to drink. You are pregnant, may be pregnant, or are planning to become pregnant. If you drink alcohol: Limit how much you have to: 0-1 drink a day for women. 0-2 drinks a day for men. Know how much alcohol is in your drink. In the [place] one drink equals one 12 oz bottle of beer (355 mL), one 5 oz glass of wine (148 mL), or one 1 oz glass of hard liquor (44 mL). Lifestyle  Work with your health care provider to maintain a healthy body weight or to lose weight. Ask what an ideal weight is for you. Get at least 30 minutes of exercise that causes your heart to beat faster (aerobic exercise) most days of the week. Activities may include walking, swimming, or biking. Include exercise to strengthen your muscles (resistance exercise), such as Pilates or lifting weights, as part of your weekly exercise routine. Try to do these types of exercises for 30 minutes at least 3 days a week. Do not use any products that contain nicotine or tobacco. These products include cigarettes, chewing tobacco, and vaping devices, such as e-cigarettes. If you need help quitting, ask your health care provider. Monitor your blood pressure at home as told by your health care provider. Keep all follow-up visits. This is important. Medicines Take over-the-counter and prescription medicines only as told by your health care provider. Follow directions carefully. Blood  pressure medicines must be taken as prescribed. Do not skip doses of blood pressure medicine. Doing this puts you at risk for problems and can make the medicine less effective. Ask your health care provider about side effects or reactions to medicines that you should watch for. Contact a health care provider if you: Think you are having a reaction to a medicine you are taking. Have headaches that keep coming back (recurring). Feel dizzy. Have swelling in your ankles. Have trouble with your vision. Get help right away if you: Develop a severe headache or confusion. Have unusual weakness or numbness. Feel faint. Have severe pain in your chest or abdomen. Vomit repeatedly. Have trouble breathing. These symptoms may be an emergency. Get help right away. Call 911. Do not wait to see if the symptoms will go away. Do not drive yourself to the hospital. Summary Hypertension is when the force of blood pumping through your arteries is too strong. If this condition is not controlled, it may put you at risk for serious complications. Your personal target blood pressure may vary depending on your medical conditions, your age, and other factors. For most people, a normal blood pressure is less than 120/80. Hypertension is treated with lifestyle changes, medicines, or a combination of both. Lifestyle changes include losing weight, eating a healthy,  low-sodium diet, exercising more, and limiting alcohol. This information is not intended to replace advice given to you by your health care provider. Make sure you discuss any questions you have with your health care provider. Document Revised: 12/06/2020 Document Reviewed: 12/06/2020 Elsevier Patient Education  2024 [employer].

## 2023-12-22 LAB — METHYLMALONIC ACID, SERUM: Methylmalonic Acid, Quant: 641 nmol/L — ABNORMAL HIGH (ref 69–390)

## 2023-12-23 ENCOUNTER — Telehealth: Payer: Self-pay | Admitting: *Deleted

## 2023-12-23 ENCOUNTER — Telehealth: Payer: Self-pay

## 2023-12-23 NOTE — Progress Notes (Unsigned)
 Care Guide Pharmacy Note  12/23/2023 Name: BASIM BARTNIK MRN: [id number] DOB: 1955-07-16  Referred By: Joshua Debby CROME, MD Reason for referral: Call Attempt #1 and Complex Care Management (Outreach to schedule referral with pharmacist )   HERVEY WEDIG is a 68 y.o. year old male who is a primary care patient of Joshua Debby CROME, MD.  [name] was referred to the pharmacist for assistance related to: HTN and HLD  An unsuccessful telephone outreach was attempted today to contact the patient who was referred to the pharmacy team for assistance with medication management. Additional attempts will be made to contact the patient.  Thedford Franks, CMA [place]  [facility], [facility] Guide Direct Dial: [phone]  Fax: [phone] Website: [email or web address]

## 2023-12-23 NOTE — Telephone Encounter (Signed)
 2000 MCG each day

## 2023-12-23 NOTE — Telephone Encounter (Signed)
 Copied from CRM [id number]. Topic: Clinical - Medication Question >> Dec 23, 2023 10:42 AM Adelita E wrote: Reason for CRM: Patient was seen by PCP on 11/6 and was advised to take OTC B12, patient would like to confirm which dosage he should buy.

## 2023-12-23 NOTE — Telephone Encounter (Signed)
 Patient has been made aware and gave a verbal understanding.

## 2023-12-23 NOTE — Telephone Encounter (Signed)
**Note De-identified  [place] Obfuscation** Please advise 

## 2023-12-24 NOTE — Progress Notes (Unsigned)
 Care Guide Pharmacy Note  12/24/2023 Name: Jeremiah Mitchell MRN: [id number] DOB: Apr 16, 1955  Referred By: Joshua Debby CROME, MD Reason for referral: Call Attempt #1 and Complex Care Management (Outreach to schedule referral with pharmacist )   Jeremiah Mitchell is a 68 y.o. year old male who is a primary care patient of Joshua Debby CROME, MD.  Jeremiah Mitchell was referred to the pharmacist for assistance related to: HTN and HLD  A second unsuccessful telephone outreach was attempted today to contact the patient who was referred to the pharmacy team for assistance with medication management. Additional attempts will be made to contact the patient.  Thedford Franks, CMA [place]  [facility], [facility] Guide Direct Dial: [phone]  Fax: [phone] Website: [email or web address]

## 2023-12-25 NOTE — Progress Notes (Signed)
 Care Guide Pharmacy Note  12/25/2023 Name: Jeremiah Mitchell MRN: [id number] DOB: Nov 07, 1955  Referred By: Joshua Debby CROME, MD Reason for referral: Call Attempt #1 and Complex Care Management (Outreach to schedule referral with pharmacist )   Jeremiah Mitchell is a 68 y.o. year old male who is a primary care patient of Joshua Debby CROME, MD.  Jeremiah Mitchell was referred to the pharmacist for assistance related to: HTN and HLD  A third unsuccessful telephone outreach was attempted today to contact the patient who was referred to the pharmacy team for assistance with medication management. The Population Health team is pleased to engage with this patient at any time in the future upon receipt of referral and should he/she be interested in assistance from the Population Health team.  Jeremiah Mitchell, CMA [facility]  [facility], [facility] Guide Direct Dial: [phone]  Fax: [phone] Website: [email or web address]

## 2024-03-08 ENCOUNTER — Other Ambulatory Visit: Payer: Self-pay | Admitting: Internal Medicine

## 2024-03-08 DIAGNOSIS — I10 Essential (primary) hypertension: Secondary | ICD-10-CM

## 2024-03-08 DIAGNOSIS — E538 Deficiency of other specified B group vitamins: Secondary | ICD-10-CM

## 2024-06-17 ENCOUNTER — Ambulatory Visit: Admitting: Internal Medicine

## 2024-10-07 ENCOUNTER — Ambulatory Visit
